# Patient Record
Sex: Female | Born: 1990
Health system: Southern US, Community
[De-identification: ages and names within clinical notes are randomized; demographics above are authoritative.]

## PROBLEM LIST (undated history)

## (undated) DIAGNOSIS — A549 Gonococcal infection, unspecified: Secondary | ICD-10-CM

## (undated) DIAGNOSIS — B9689 Other specified bacterial agents as the cause of diseases classified elsewhere: Secondary | ICD-10-CM

## (undated) HISTORY — DX: Other specified bacterial agents as the cause of diseases classified elsewhere: B96.89

## (undated) HISTORY — DX: Gonococcal infection, unspecified: A54.9

---

## 2012-09-10 ENCOUNTER — Emergency Department (HOSPITAL_BASED_OUTPATIENT_CLINIC_OR_DEPARTMENT_OTHER): Payer: No Typology Code available for payment source

## 2012-09-10 ENCOUNTER — Encounter (HOSPITAL_BASED_OUTPATIENT_CLINIC_OR_DEPARTMENT_OTHER): Payer: Self-pay | Admitting: *Deleted

## 2012-09-10 ENCOUNTER — Emergency Department (HOSPITAL_BASED_OUTPATIENT_CLINIC_OR_DEPARTMENT_OTHER)
Admission: EM | Admit: 2012-09-10 | Discharge: 2012-09-10 | Disposition: A | Discharge status: Home / Self Care | Payer: No Typology Code available for payment source | Attending: Emergency Medicine | Admitting: Emergency Medicine

## 2012-09-10 DIAGNOSIS — Y9389 Activity, other specified: Secondary | ICD-10-CM | POA: Insufficient documentation

## 2012-09-10 DIAGNOSIS — S6990XA Unspecified injury of unspecified wrist, hand and finger(s), initial encounter: Secondary | ICD-10-CM | POA: Insufficient documentation

## 2012-09-10 DIAGNOSIS — Y92410 Unspecified street and highway as the place of occurrence of the external cause: Secondary | ICD-10-CM | POA: Insufficient documentation

## 2012-09-10 MED ORDER — TRAMADOL HCL 50 MG PO TABS: 50.0000 mg | ORAL_TABLET | Freq: Four times a day (QID) | ORAL | Status: DC | PRN
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
## 2012-09-10 NOTE — ED Provider Notes (Signed)
Medical screening examination/treatment/procedure(s) were performed by non-physician practitioner and as supervising physician I was immediately available for consultation/collaboration.   Rolan Bucco, MD 09/10/12 [phone]
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
## 2012-09-14 ENCOUNTER — Emergency Department (HOSPITAL_BASED_OUTPATIENT_CLINIC_OR_DEPARTMENT_OTHER)
Admission: EM | Admit: 2012-09-14 | Discharge: 2012-09-14 | Disposition: A | Discharge status: Home / Self Care | Payer: No Typology Code available for payment source | Attending: Emergency Medicine | Admitting: Emergency Medicine

## 2012-09-14 ENCOUNTER — Encounter (HOSPITAL_BASED_OUTPATIENT_CLINIC_OR_DEPARTMENT_OTHER): Payer: Self-pay | Admitting: *Deleted

## 2012-09-14 DIAGNOSIS — S6990XA Unspecified injury of unspecified wrist, hand and finger(s), initial encounter: Secondary | ICD-10-CM

## 2012-09-14 DIAGNOSIS — Z0279 Encounter for issue of other medical certificate: Secondary | ICD-10-CM | POA: Insufficient documentation

## 2012-09-14 DIAGNOSIS — Z09 Encounter for follow-up examination after completed treatment for conditions other than malignant neoplasm: Secondary | ICD-10-CM | POA: Insufficient documentation

## 2012-09-14 NOTE — ED Notes (Signed)
Pt seen here sat for hand injury , requesting work note to go back to work, Previous note  Was till  Feb 3

## 2012-09-14 NOTE — ED Provider Notes (Signed)
History     CSN: [id number]  Arrival date & time 09/14/12  1615   First MD Initiated Contact with Patient 09/14/12 1628      Chief Complaint  Patient presents with  . Follow-up    (Consider location/radiation/quality/duration/timing/severity/associated sxs/prior treatment) HPI Pt reports she was seen in the ED for a hand injury about 4-5 days ago. She is feeling better, pain mostly resolved and she needs a note clearing her to return to work.   History reviewed. No pertinent past medical history.  History reviewed. No pertinent past surgical history.  History reviewed. No pertinent family history.  History  Substance Use Topics  . Smoking status: Never Smoker   . Smokeless tobacco: Not on file  . Alcohol Use: No    OB History    Grav Para Term Preterm Abortions TAB SAB Ect Mult Living                  Review of Systems   All other systems reviewed and are negative except as noted in HPI.   Allergies  Review of patient's allergies indicates not on file.  Home Medications   Current Outpatient Rx  Name  Route  Sig  Dispense  Refill  . TRAMADOL HCL 50 MG PO TABS   Oral   Take 1 tablet (50 mg total) by mouth every 6 (six) hours as needed for pain.   6 tablet   0     BP 111/68  Pulse 73  Temp 98.6 F (37 C) (Oral)  Resp 16  Ht 5\' 6"  (1.676 m)  Wt 147 lb (66.679 kg)  BMI 23.73 kg/m2  SpO2 100%  LMP 08/20/2012  Physical Exam  Nursing note and vitals reviewed. Constitutional: She is oriented to person, place, and time. She appears well-developed and well-nourished.  HENT:  Head: Normocephalic and atraumatic.  Eyes: EOM are normal.  Neck: Normal range of motion. Neck supple.  Cardiovascular: Intact distal pulses.   Pulmonary/Chest: Effort normal.  Musculoskeletal: Normal range of motion. She exhibits no edema and no tenderness.  Neurological: She is alert and oriented to person, place, and time. She has normal strength. No cranial nerve deficit or  sensory deficit.  Skin: Skin is warm and dry. No rash noted.  Psychiatric: She has a normal mood and affect.    ED Course  Procedures (including critical care time)  Labs Reviewed - No data to display No results found.   No diagnosis found.    MDM  Work note provided.         Charles B. Bernette Mayers, MD 09/14/12 [phone]

## 2013-07-28 ENCOUNTER — Emergency Department (HOSPITAL_BASED_OUTPATIENT_CLINIC_OR_DEPARTMENT_OTHER)
Admission: EM | Admit: 2013-07-28 | Discharge: 2013-07-28 | Discharge status: Against Medical Advice | Payer: Self-pay | Attending: Emergency Medicine | Admitting: Emergency Medicine

## 2013-07-28 ENCOUNTER — Encounter (HOSPITAL_BASED_OUTPATIENT_CLINIC_OR_DEPARTMENT_OTHER): Payer: Self-pay | Admitting: Emergency Medicine

## 2013-07-28 DIAGNOSIS — R109 Unspecified abdominal pain: Secondary | ICD-10-CM | POA: Insufficient documentation

## 2013-07-28 DIAGNOSIS — R070 Pain in throat: Secondary | ICD-10-CM | POA: Insufficient documentation

## 2013-07-28 NOTE — ED Notes (Signed)
Patient called x 3 for treatment room with no response

## 2013-07-28 NOTE — ED Notes (Addendum)
Pt c/o URI symptoms x 2 days also c/o abd pain prior to menstral cycle every moth

## 2013-09-25 ENCOUNTER — Emergency Department (HOSPITAL_BASED_OUTPATIENT_CLINIC_OR_DEPARTMENT_OTHER)
Admission: EM | Admit: 2013-09-25 | Discharge: 2013-09-25 | Disposition: A | Discharge status: Home / Self Care | Payer: 59 | Attending: Emergency Medicine | Admitting: Emergency Medicine

## 2013-09-25 ENCOUNTER — Encounter (HOSPITAL_BASED_OUTPATIENT_CLINIC_OR_DEPARTMENT_OTHER): Payer: Self-pay | Admitting: Emergency Medicine

## 2013-09-25 DIAGNOSIS — R110 Nausea: Secondary | ICD-10-CM | POA: Insufficient documentation

## 2013-09-25 DIAGNOSIS — R109 Unspecified abdominal pain: Secondary | ICD-10-CM

## 2013-09-25 DIAGNOSIS — Z87440 Personal history of urinary (tract) infections: Secondary | ICD-10-CM | POA: Insufficient documentation

## 2013-09-25 DIAGNOSIS — R1032 Left lower quadrant pain: Secondary | ICD-10-CM | POA: Insufficient documentation

## 2013-09-25 DIAGNOSIS — Z3202 Encounter for pregnancy test, result negative: Secondary | ICD-10-CM | POA: Insufficient documentation

## 2013-09-25 LAB — URINALYSIS, ROUTINE W REFLEX MICROSCOPIC
Bilirubin Urine: NEGATIVE
GLUCOSE, UA: NEGATIVE mg/dL
Hgb urine dipstick: NEGATIVE
Ketones, ur: NEGATIVE mg/dL
LEUKOCYTES UA: NEGATIVE
Nitrite: NEGATIVE
PH: 7 (ref 5.0–8.0)
PROTEIN: NEGATIVE mg/dL
SPECIFIC GRAVITY, URINE: 1.02 (ref 1.005–1.030)
Urobilinogen, UA: 1 mg/dL (ref 0.0–1.0)

## 2013-09-25 LAB — PREGNANCY, URINE: PREG TEST UR: NEGATIVE

## 2013-09-25 NOTE — ED Notes (Signed)
NP at bedside.

## 2013-09-25 NOTE — Discharge Instructions (Signed)
Abdominal Pain, Women °Abdominal (stomach, pelvic, or belly) pain can be caused by many things. It is important to tell your doctor: °· The location of the pain. °· Does it come and go or is it present all the time? °· Are there things that start the pain (eating certain foods, exercise)? °· Are there other symptoms associated with the pain (fever, nausea, vomiting, diarrhea)? °All of this is helpful to know when trying to find the cause of the pain. °CAUSES  °· Stomach: virus or bacteria infection, or ulcer. °· Intestine: appendicitis (inflamed appendix), regional ileitis (Crohn's disease), ulcerative colitis (inflamed colon), irritable bowel syndrome, diverticulitis (inflamed diverticulum of the colon), or cancer of the stomach or intestine. °· Gallbladder disease or stones in the gallbladder. °· Kidney disease, kidney stones, or infection. °· Pancreas infection or cancer. °· Fibromyalgia (pain disorder). °· Diseases of the female organs: °· Uterus: fibroid (non-cancerous) tumors or infection. °· Fallopian tubes: infection or tubal pregnancy. °· Ovary: cysts or tumors. °· Pelvic adhesions (scar tissue). °· Endometriosis (uterus lining tissue growing in the pelvis and on the pelvic organs). °· Pelvic congestion syndrome (female organs filling up with blood just before the menstrual period). °· Pain with the menstrual period. °· Pain with ovulation (producing an egg). °· Pain with an IUD (intrauterine device, birth control) in the uterus. °· Cancer of the female organs. °· Functional pain (pain not caused by a disease, may improve without treatment). °· Psychological pain. °· Depression. °DIAGNOSIS  °Your doctor will decide the seriousness of your pain by doing an examination. °· Blood tests. °· X-rays. °· Ultrasound. °· CT scan (computed tomography, special type of X-ray). °· MRI (magnetic resonance imaging). °· Cultures, for infection. °· Barium enema (dye inserted in the large intestine, to better view it with  X-rays). °· Colonoscopy (looking in intestine with a lighted tube). °· Laparoscopy (minor surgery, looking in abdomen with a lighted tube). °· Major abdominal exploratory surgery (looking in abdomen with a large incision). °TREATMENT  °The treatment will depend on the cause of the pain.  °· Many cases can be observed and treated at home. °· Over-the-counter medicines recommended by your caregiver. °· Prescription medicine. °· Antibiotics, for infection. °· Birth control pills, for painful periods or for ovulation pain. °· Hormone treatment, for endometriosis. °· Nerve blocking injections. °· Physical therapy. °· Antidepressants. °· Counseling with a psychologist or psychiatrist. °· Minor or major surgery. °HOME CARE INSTRUCTIONS  °· Do not take laxatives, unless directed by your caregiver. °· Take over-the-counter pain medicine only if ordered by your caregiver. Do not take aspirin because it can cause an upset stomach or bleeding. °· Try a clear liquid diet (broth or water) as ordered by your caregiver. Slowly move to a bland diet, as tolerated, if the pain is related to the stomach or intestine. °· Have a thermometer and take your temperature several times a day, and record it. °· Bed rest and sleep, if it helps the pain. °· Avoid sexual intercourse, if it causes pain. °· Avoid stressful situations. °· Keep your follow-up appointments and tests, as your caregiver orders. °· If the pain does not go away with medicine or surgery, you may try: °· Acupuncture. °· Relaxation exercises (yoga, meditation). °· Group therapy. °· Counseling. °SEEK MEDICAL CARE IF:  °· You notice certain foods cause stomach pain. °· Your home care treatment is not helping your pain. °· You need stronger pain medicine. °· You want your IUD removed. °· You feel faint or   lightheaded. °· You develop nausea and vomiting. °· You develop a rash. °· You are having side effects or an allergy to your medicine. °SEEK IMMEDIATE MEDICAL CARE IF:  °· Your  pain does not go away or gets worse. °· You have a fever. °· Your pain is felt only in portions of the abdomen. The right side could possibly be appendicitis. The left lower portion of the abdomen could be colitis or diverticulitis. °· You are passing blood in your stools (bright red or black tarry stools, with or without vomiting). °· You have blood in your urine. °· You develop chills, with or without a fever. °· You pass out. °MAKE SURE YOU:  °· Understand these instructions. °· Will watch your condition. °· Will get help right away if you are not doing well or get worse. °Document Released: 05/31/2007 Document Revised: 10/26/2011 Document Reviewed: 06/20/2009 °ExitCare® Patient Information ©2014 [employer]. ° °

## 2013-09-25 NOTE — ED Notes (Signed)
Pt c/o LLQ pain x 2 months intermittent.

## 2013-09-25 NOTE — ED Provider Notes (Signed)
CSN: [id number]     Arrival date & time 09/25/13  1653 History   First MD Initiated Contact with Patient 09/25/13 1658     Chief Complaint  Patient presents with  . Abdominal Pain     (Consider location/radiation/quality/duration/timing/severity/associated sxs/prior Treatment) HPI Comments: Pt c/o intermittent llq abdominal pain that last for about 1 hour at a time and has been going on for 2 months;denies dysuria,discharge,vomiting, diarrhea;has had some nausea and wants to make sure that she isn't pregnant:pt not having the pain currently:was recently treated for uti and tested for std  The history is provided by the patient. No language interpreter was used.    History reviewed. No pertinent past medical history. History reviewed. No pertinent past surgical history. No family history on file. History  Substance Use Topics  . Smoking status: Never Smoker   . Smokeless tobacco: Not on file  . Alcohol Use: No   OB History   Grav Para Term Preterm Abortions TAB SAB Ect Mult Living                 Review of Systems  Constitutional: Negative.   Respiratory: Negative.   Cardiovascular: Negative.       Allergies  Review of patient's allergies indicates no known allergies.  Home Medications  No current outpatient prescriptions on file. BP 117/82  Pulse 68  Temp(Src) 98.1 F (36.7 C) (Oral)  Resp 16  Ht 5\' 6"  (1.676 m)  Wt 140 lb (63.504 kg)  BMI 22.61 kg/m2  SpO2 100%  LMP 09/11/2013 Physical Exam  Nursing note and vitals reviewed. Constitutional: She is oriented to person, place, and time. She appears well-developed and well-nourished.  HENT:  Head: Normocephalic and atraumatic.  Cardiovascular: Normal rate and regular rhythm.   Pulmonary/Chest: Effort normal and breath sounds normal.  Abdominal: Soft. Bowel sounds are normal. There is no tenderness. There is no rebound and no guarding.  Musculoskeletal: Normal range of motion.  Neurological: She is alert and  oriented to person, place, and time.  Skin: Skin is warm and dry.  Psychiatric: She has a normal mood and affect.    ED Course  Procedures (including critical care time) Labs Review Labs Reviewed  URINALYSIS, ROUTINE W REFLEX MICROSCOPIC  PREGNANCY, URINE   Imaging Review No results found.  EKG Interpretation   None       MDM   Final diagnoses:  Abdominal pain    Pt not pregnant and abdomen benign:don't think imaging is needed at this time    Teressa LowerVrinda Anthone Prieur, NP 09/25/13 1813

## 2013-09-30 NOTE — ED Provider Notes (Signed)
Medical screening examination/treatment/procedure(s) were performed by non-physician practitioner and as supervising physician I was immediately available for consultation/collaboration.  EKG Interpretation   None         Rolland PorterMark Isacc Turney, MD 09/30/13 [phone]

## 2013-12-04 ENCOUNTER — Telehealth: Payer: Self-pay | Admitting: *Deleted

## 2013-12-04 ENCOUNTER — Encounter: Payer: Self-pay | Admitting: Nurse Practitioner

## 2013-12-04 NOTE — Telephone Encounter (Signed)
Called Tabitha Robinson and left a message that she missed her scheduled appointment for today at 1:15. Please call back [phone] if you wish to reschedule.

## 2014-05-24 ENCOUNTER — Encounter (HOSPITAL_BASED_OUTPATIENT_CLINIC_OR_DEPARTMENT_OTHER): Payer: Self-pay | Admitting: Emergency Medicine

## 2014-05-24 ENCOUNTER — Emergency Department (HOSPITAL_BASED_OUTPATIENT_CLINIC_OR_DEPARTMENT_OTHER)
Admission: EM | Admit: 2014-05-24 | Discharge: 2014-05-24 | Disposition: A | Discharge status: Home / Self Care | Payer: 59 | Attending: Emergency Medicine | Admitting: Emergency Medicine

## 2014-05-24 DIAGNOSIS — H9209 Otalgia, unspecified ear: Secondary | ICD-10-CM | POA: Insufficient documentation

## 2014-05-24 DIAGNOSIS — R05 Cough: Secondary | ICD-10-CM | POA: Insufficient documentation

## 2014-05-24 DIAGNOSIS — J029 Acute pharyngitis, unspecified: Secondary | ICD-10-CM

## 2014-05-24 LAB — RAPID STREP SCREEN (MED CTR MEBANE ONLY): STREPTOCOCCUS, GROUP A SCREEN (DIRECT): NEGATIVE

## 2014-05-24 MED ORDER — IBUPROFEN 800 MG PO TABS: 800.0000 mg | ORAL_TABLET | Freq: Three times a day (TID) | ORAL | Status: DC

## 2014-05-24 MED ORDER — BENZONATATE 100 MG PO CAPS: 100.0000 mg | ORAL_CAPSULE | Freq: Three times a day (TID) | ORAL | Status: DC

## 2014-05-24 MED ORDER — LIDOCAINE VISCOUS 2 % MT SOLN
15.0000 mL | Freq: Once | OROMUCOSAL | Status: AC
  Administered 2014-05-24: 15 mL via OROMUCOSAL
  Filled 2014-05-24: qty 15

## 2014-05-24 MED ORDER — IBUPROFEN 800 MG PO TABS
800.0000 mg | ORAL_TABLET | Freq: Once | ORAL | Status: AC
  Administered 2014-05-24: 800 mg via ORAL
  Filled 2014-05-24: qty 1

## 2014-05-24 MED ORDER — LIDOCAINE VISCOUS 2 % MT SOLN: 20.0000 mL | OROMUCOSAL | Status: DC | PRN

## 2014-05-24 NOTE — ED Notes (Signed)
Pt c/o sore throat and painful swallowing x 4 days- right ear pain also

## 2014-05-24 NOTE — ED Provider Notes (Signed)
CSN: [id number]     Arrival date & time 05/24/14  1216 History   First MD Initiated Contact with Patient 05/24/14 1220     Chief Complaint  Patient presents with  . Sore Throat     (Consider location/radiation/quality/duration/timing/severity/associated sxs/prior Treatment) HPI Comments: Patient is a 23 year old female presented to the emergency department for 5 day history of sore throat with associated painful swallowing, productive cough, right ear pain. Alleviating factors: warm water with honey. Aggravating factors: eating, drinking. Medications tried prior to arrival: Aspirin. Denies any sick contacts. Denies any fevers, chills, headache, nasal congestion or rhinorrhea, nausea, vomiting, abdominal pain. No known sick contacts.    Patient is a 23 y.o. female presenting with pharyngitis.  Sore Throat Associated symptoms include coughing and a sore throat. Pertinent negatives include no chills or fever.    History reviewed. No pertinent past medical history. History reviewed. No pertinent past surgical history. No family history on file. History  Substance Use Topics  . Smoking status: Never Smoker   . Smokeless tobacco: Not on file  . Alcohol Use: No   OB History   Grav Para Term Preterm Abortions TAB SAB Ect Mult Living                 Review of Systems  Constitutional: Negative for fever and chills.  HENT: Positive for ear pain and sore throat.   Respiratory: Positive for cough.   All other systems reviewed and are negative.     Allergies  Review of patient's allergies indicates no known allergies.  Home Medications   Prior to Admission medications   Medication Sig Start Date End Date Taking? Authorizing Provider  benzonatate (TESSALON) 100 MG capsule Take 1 capsule (100 mg total) by mouth every 8 (eight) hours. 05/24/14   Jenaro Souder L Bellah Alia, PA-C  ibuprofen (ADVIL,MOTRIN) 800 MG tablet Take 1 tablet (800 mg total) by mouth 3 (three) times daily. 05/24/14    Draylen Lobue L Marysol Wellnitz, PA-C  lidocaine (XYLOCAINE) 2 % solution Use as directed 20 mLs in the mouth or throat as needed for mouth pain. 05/24/14   Shahan Starks L Tersa Fotopoulos, PA-C   BP 112/65  Pulse 67  Temp(Src) 98.2 F (36.8 C) (Oral)  Resp 18  Ht 5\' 6"  (1.676 m)  Wt 156 lb (70.761 kg)  BMI 25.19 kg/m2  SpO2 100%  LMP 05/20/2014 Physical Exam  Nursing note and vitals reviewed. Constitutional: She is oriented to person, place, and time. She appears well-developed and well-nourished. No distress.  HENT:  Head: Normocephalic and atraumatic.  Right Ear: Hearing, tympanic membrane, external ear and ear canal normal.  Left Ear: Hearing, tympanic membrane, external ear and ear canal normal.  Nose: Nose normal.  Mouth/Throat: Uvula is midline and mucous membranes are normal. Posterior oropharyngeal erythema present. No oropharyngeal exudate, posterior oropharyngeal edema or tonsillar abscesses.  Eyes: Conjunctivae are normal.  Neck: Normal range of motion. Neck supple.  Cardiovascular: Normal rate, regular rhythm and normal heart sounds.   Pulmonary/Chest: Effort normal and breath sounds normal.  Abdominal: Soft.  Musculoskeletal: Normal range of motion.  Lymphadenopathy:    She has cervical adenopathy.  Neurological: She is alert and oriented to person, place, and time.  Skin: Skin is warm and dry. She is not diaphoretic.  Psychiatric: She has a normal mood and affect.    ED Course  Procedures (including critical care time) Medications  ibuprofen (ADVIL,MOTRIN) tablet 800 mg (800 mg Oral Given 05/24/14 1300)  lidocaine (XYLOCAINE) 2 % viscous mouth  solution 15 mL (15 mLs Mouth/Throat Given 05/24/14 1259)    Labs Review Labs Reviewed  RAPID STREP SCREEN  CULTURE, GROUP A STREP    Imaging Review No results found.   EKG Interpretation None      MDM   Final diagnoses:  Viral pharyngitis    Filed Vitals:   05/24/14 1327  BP: 112/65  Pulse: 67  Temp: 98.2 F (36.8 C)   Resp: 18    Pt afebrile without tonsillar exudate, negative strep. Presents with mild cervical lymphadenopathy, & dysphagia; diagnosis of viral pharyngitis. No abx indicated. DC w symptomatic tx for pain  Pt does not appear dehydrated, but did discuss importance of water rehydration. Presentation non concerning for PTA or infxn spread to soft tissue. No trismus or uvula deviation. Specific return precautions discussed. Pt able to drink water in ED without difficulty with intact air way. Recommended PCP follow up. Patient is stable at time of discharge      Jeannetta EllisJennifer L Yeny Schmoll, PA-C 05/24/14 1330

## 2014-05-24 NOTE — Discharge Instructions (Signed)
Please follow up with your primary care physician in 1-2 days. If you do not have one please call the [facility] number listed above. Please take medications as prescribed. Please read all discharge instructions and return precautions.  ° °Pharyngitis °Pharyngitis is redness, pain, and swelling (inflammation) of your pharynx.  °CAUSES  °Pharyngitis is usually caused by infection. Most of the time, these infections are from viruses (viral) and are part of a cold. However, sometimes pharyngitis is caused by bacteria (bacterial). Pharyngitis can also be caused by allergies. Viral pharyngitis may be spread from person to person by coughing, sneezing, and personal items or utensils (cups, forks, spoons, toothbrushes). Bacterial pharyngitis may be spread from person to person by more intimate contact, such as kissing.  °SIGNS AND SYMPTOMS  °Symptoms of pharyngitis include:   °· Sore throat.   °· Tiredness (fatigue).   °· Low-grade fever.   °· Headache. °· Joint pain and muscle aches. °· Skin rashes. °· Swollen lymph nodes. °· Plaque-like film on throat or tonsils (often seen with bacterial pharyngitis). °DIAGNOSIS  °Your health care provider will ask you questions about your illness and your symptoms. Your medical history, along with a physical exam, is often all that is needed to diagnose pharyngitis. Sometimes, a rapid strep test is done. Other lab tests may also be done, depending on the suspected cause.  °TREATMENT  °Viral pharyngitis will usually get better in 3-4 days without the use of medicine. Bacterial pharyngitis is treated with medicines that kill germs (antibiotics).  °HOME CARE INSTRUCTIONS  °· Drink enough water and fluids to keep your urine clear or pale yellow.   °· Only take over-the-counter or prescription medicines as directed by your health care provider:   °¨ If you are prescribed antibiotics, make sure you finish them even if you start to feel better.   °¨ Do not take aspirin.    °· Get lots of rest.   °· Gargle with 8 oz of salt water (½ tsp of salt per 1 qt of water) as often as every 1-2 hours to soothe your throat.   °· Throat lozenges (if you are not at risk for choking) or sprays may be used to soothe your throat. °SEEK MEDICAL CARE IF:  °· You have large, tender lumps in your neck. °· You have a rash. °· You cough up green, yellow-brown, or bloody spit. °SEEK IMMEDIATE MEDICAL CARE IF:  °· Your neck becomes stiff. °· You drool or are unable to swallow liquids. °· You vomit or are unable to keep medicines or liquids down. °· You have severe pain that does not go away with the use of recommended medicines. °· You have trouble breathing (not caused by a stuffy nose). °MAKE SURE YOU:  °· Understand these instructions. °· Will watch your condition. °· Will get help right away if you are not doing well or get worse. °Document Released: 08/03/2005 Document Revised: 05/24/2013 Document Reviewed: 04/10/2013 °[employer]® Patient Information ©2015 [employer]. This information is not intended to replace advice given to you by your health care provider. Make sure you discuss any questions you have with your health care provider. ° ° °

## 2014-05-26 LAB — CULTURE, GROUP A STREP

## 2014-05-28 NOTE — ED Provider Notes (Signed)
Medical screening examination/treatment/procedure(s) were conducted as a shared visit with non-physician practitioner(s) or resident  and myself.  I personally evaluated the patient during the encounter and agree with the findings.   I have personally reviewed any xrays and/ or EKG's with the provider and I agree with interpretation.   Well-appearing female with sore throat and cough for the past 4-5 days. Patient well-appearing, mild dry mucous membranes, no significant posterior swelling/exudate. No trismus, neck supple no meningismus. Supportive care discussed.  Labs Reviewed  RAPID STREP SCREEN  CULTURE, GROUP A STREP   Acute pharyngitis   Enid SkeensJoshua M Verita Kuroda, MD 05/28/14 1320

## 2014-08-01 ENCOUNTER — Emergency Department (HOSPITAL_BASED_OUTPATIENT_CLINIC_OR_DEPARTMENT_OTHER)
Admission: EM | Admit: 2014-08-01 | Discharge: 2014-08-01 | Disposition: A | Discharge status: Home / Self Care | Payer: Self-pay | Attending: Emergency Medicine | Admitting: Emergency Medicine

## 2014-08-01 ENCOUNTER — Emergency Department (HOSPITAL_BASED_OUTPATIENT_CLINIC_OR_DEPARTMENT_OTHER): Payer: 59

## 2014-08-01 ENCOUNTER — Encounter (HOSPITAL_BASED_OUTPATIENT_CLINIC_OR_DEPARTMENT_OTHER): Payer: Self-pay

## 2014-08-01 DIAGNOSIS — R1030 Lower abdominal pain, unspecified: Secondary | ICD-10-CM

## 2014-08-01 DIAGNOSIS — N3090 Cystitis, unspecified without hematuria: Secondary | ICD-10-CM | POA: Insufficient documentation

## 2014-08-01 DIAGNOSIS — Z3202 Encounter for pregnancy test, result negative: Secondary | ICD-10-CM | POA: Insufficient documentation

## 2014-08-01 DIAGNOSIS — R630 Anorexia: Secondary | ICD-10-CM | POA: Insufficient documentation

## 2014-08-01 DIAGNOSIS — N730 Acute parametritis and pelvic cellulitis: Secondary | ICD-10-CM | POA: Insufficient documentation

## 2014-08-01 LAB — URINALYSIS, ROUTINE W REFLEX MICROSCOPIC
GLUCOSE, UA: NEGATIVE mg/dL
Ketones, ur: >80 mg/dL — AB
Nitrite: NEGATIVE
PROTEIN: 100 mg/dL — AB
Specific Gravity, Urine: 1.031 — ABNORMAL HIGH (ref 1.005–1.030)
Urobilinogen, UA: 1 mg/dL (ref 0.0–1.0)
pH: 5.5 (ref 5.0–8.0)

## 2014-08-01 LAB — CBC WITH DIFFERENTIAL/PLATELET
Basophils Absolute: 0 K/uL (ref 0.0–0.1)
Basophils Relative: 0 % (ref 0–1)
Eosinophils Absolute: 0 K/uL (ref 0.0–0.7)
Eosinophils Relative: 0 % (ref 0–5)
HCT: 36.1 % (ref 36.0–46.0)
Hemoglobin: 12.1 g/dL (ref 12.0–15.0)
Lymphocytes Relative: 8 % — ABNORMAL LOW (ref 12–46)
Lymphs Abs: 1.6 K/uL (ref 0.7–4.0)
MCH: 30.1 pg (ref 26.0–34.0)
MCHC: 33.5 g/dL (ref 30.0–36.0)
MCV: 89.8 fL (ref 78.0–100.0)
Monocytes Absolute: 1.2 K/uL — ABNORMAL HIGH (ref 0.1–1.0)
Monocytes Relative: 6 % (ref 3–12)
Neutro Abs: 17.8 K/uL — ABNORMAL HIGH (ref 1.7–7.7)
Neutrophils Relative %: 86 % — ABNORMAL HIGH (ref 43–77)
Platelets: 204 K/uL (ref 150–400)
RBC: 4.02 MIL/uL (ref 3.87–5.11)
RDW: 12.6 % (ref 11.5–15.5)
WBC: 20.7 K/uL — ABNORMAL HIGH (ref 4.0–10.5)

## 2014-08-01 LAB — COMPREHENSIVE METABOLIC PANEL WITH GFR
ALT: 13 U/L (ref 0–35)
AST: 14 U/L (ref 0–37)
Albumin: 4.1 g/dL (ref 3.5–5.2)
Alkaline Phosphatase: 64 U/L (ref 39–117)
Anion gap: 15 (ref 5–15)
BUN: 10 mg/dL (ref 6–23)
CO2: 23 meq/L (ref 19–32)
Calcium: 9.6 mg/dL (ref 8.4–10.5)
Chloride: 98 meq/L (ref 96–112)
Creatinine, Ser: 0.7 mg/dL (ref 0.50–1.10)
GFR calc Af Amer: >90 mL/min
GFR calc non Af Amer: >90 mL/min
Glucose, Bld: 91 mg/dL (ref 70–99)
Potassium: 3.7 meq/L (ref 3.7–5.3)
Sodium: 136 meq/L — ABNORMAL LOW (ref 137–147)
Total Bilirubin: 0.8 mg/dL (ref 0.3–1.2)
Total Protein: 7.9 g/dL (ref 6.0–8.3)

## 2014-08-01 LAB — RPR

## 2014-08-01 LAB — URINE MICROSCOPIC-ADD ON

## 2014-08-01 LAB — WET PREP, GENITAL
Trich, Wet Prep: NONE SEEN
Yeast Wet Prep HPF POC: NONE SEEN

## 2014-08-01 LAB — HIV ANTIBODY (ROUTINE TESTING W REFLEX): HIV 1&2 Ab, 4th Generation: NONREACTIVE

## 2014-08-01 LAB — PREGNANCY, URINE: Preg Test, Ur: NEGATIVE

## 2014-08-01 MED ORDER — ACETAMINOPHEN 500 MG PO TABS
1000.0000 mg | ORAL_TABLET | Freq: Once | ORAL | Status: AC
  Administered 2014-08-01: 1000 mg via ORAL
  Filled 2014-08-01: qty 2

## 2014-08-01 MED ORDER — IOHEXOL 300 MG/ML  SOLN
25.0000 mL | Freq: Once | INTRAMUSCULAR | Status: AC | PRN
  Administered 2014-08-01: 25 mL via ORAL

## 2014-08-01 MED ORDER — ONDANSETRON 4 MG PO TBDP: ORAL_TABLET | ORAL | Status: DC

## 2014-08-01 MED ORDER — DOXYCYCLINE HYCLATE 100 MG PO TABS
100.0000 mg | ORAL_TABLET | Freq: Once | ORAL | Status: AC
  Administered 2014-08-01: 100 mg via ORAL
  Filled 2014-08-01: qty 1

## 2014-08-01 MED ORDER — DOXYCYCLINE HYCLATE 100 MG PO CAPS: 100.0000 mg | ORAL_CAPSULE | Freq: Two times a day (BID) | ORAL | Status: DC

## 2014-08-01 MED ORDER — CEFTRIAXONE SODIUM 250 MG IJ SOLR
250.0000 mg | Freq: Once | INTRAMUSCULAR | Status: AC
  Administered 2014-08-01: 250 mg via INTRAMUSCULAR
  Filled 2014-08-01: qty 250

## 2014-08-01 MED ORDER — IOHEXOL 300 MG/ML  SOLN
100.0000 mL | Freq: Once | INTRAMUSCULAR | Status: AC | PRN
  Administered 2014-08-01: 100 mL via INTRAVENOUS

## 2014-08-01 NOTE — ED Provider Notes (Signed)
CSN: [id number]     Arrival date & time 08/01/14  1121 History   First MD Initiated Contact with Patient 08/01/14 1130     Chief Complaint  Patient presents with  . Abdominal Pain     (Consider location/radiation/quality/duration/timing/severity/associated sxs/prior Treatment) Patient is a 23 y.o. female presenting with abdominal pain.  Abdominal Pain Pain location:  Suprapubic Pain quality: aching   Pain radiates to:  Does not radiate Pain severity:  Moderate Onset quality:  Gradual Duration:  1 week Timing:  Constant Progression:  Worsening Chronicity:  New Context: not recent illness   Context comment:  HO GC Relieved by:  Nothing Worsened by:  Movement and palpation Ineffective treatments:  None tried Associated symptoms: anorexia and chills   Associated symptoms: no cough, no diarrhea, no fever, no nausea and no vomiting     History reviewed. No pertinent past medical history. History reviewed. No pertinent past surgical history. No family history on file. History  Substance Use Topics  . Smoking status: Never Smoker   . Smokeless tobacco: Not on file  . Alcohol Use: No   OB History    No data available     Review of Systems  Constitutional: Positive for chills. Negative for fever.  Respiratory: Negative for cough.   Gastrointestinal: Positive for abdominal pain and anorexia. Negative for nausea, vomiting and diarrhea.  All other systems reviewed and are negative.     Allergies  Review of patient's allergies indicates no known allergies.  Home Medications   Prior to Admission medications   Medication Sig Start Date End Date Taking? Authorizing Provider  doxycycline (VIBRAMYCIN) 100 MG capsule Take 1 capsule (100 mg total) by mouth 2 (two) times daily. 08/01/14   Mirian MoMatthew Hesper Venturella, MD  ondansetron (ZOFRAN ODT) 4 MG disintegrating tablet 4mg  ODT q4 hours prn nausea/vomit 08/01/14   Mirian MoMatthew Renika Shiflet, MD   BP 103/59 mmHg  Pulse 89  Temp(Src) 100.2 F (37.9  C) (Oral)  Resp 18  SpO2 100%  LMP 07/29/2014 Physical Exam  Constitutional: She is oriented to person, place, and time. She appears well-developed and well-nourished.  HENT:  Head: Normocephalic and atraumatic.  Right Ear: External ear normal.  Left Ear: External ear normal.  Eyes: Conjunctivae and EOM are normal. Pupils are equal, round, and reactive to light.  Neck: Normal range of motion. Neck supple.  Cardiovascular: Normal rate, regular rhythm, normal heart sounds and intact distal pulses.   Pulmonary/Chest: Effort normal and breath sounds normal.  Abdominal: Soft. Bowel sounds are normal. There is no tenderness.  Genitourinary: Uterus is tender. Cervix exhibits motion tenderness and discharge. Cervix exhibits no friability. Right adnexum displays tenderness. Right adnexum displays no mass and no fullness. Left adnexum displays tenderness. Left adnexum displays no mass and no fullness. Vaginal discharge found.  Musculoskeletal: Normal range of motion.  Neurological: She is alert and oriented to person, place, and time.  Skin: Skin is warm and dry.  Vitals reviewed.   ED Course  Procedures (including critical care time) Labs Review Labs Reviewed  WET PREP, GENITAL - Abnormal; Notable for the following:    Clue Cells Wet Prep HPF POC MANY (*)    WBC, Wet Prep HPF POC TOO NUMEROUS TO COUNT (*)    All other components within normal limits  URINALYSIS, ROUTINE W REFLEX MICROSCOPIC - Abnormal; Notable for the following:    Color, Urine AMBER (*)    APPearance TURBID (*)    Specific Gravity, Urine 1.031 (*)  Hgb urine dipstick MODERATE (*)    Bilirubin Urine SMALL (*)    Ketones, ur >80 (*)    Protein, ur 100 (*)    Leukocytes, UA MODERATE (*)    All other components within normal limits  URINE MICROSCOPIC-ADD ON - Abnormal; Notable for the following:    Squamous Epithelial / LPF FEW (*)    Bacteria, UA MANY (*)    All other components within normal limits  CBC WITH  DIFFERENTIAL - Abnormal; Notable for the following:    WBC 20.7 (*)    Neutrophils Relative % 86 (*)    Neutro Abs 17.8 (*)    Lymphocytes Relative 8 (*)    Monocytes Absolute 1.2 (*)    All other components within normal limits  COMPREHENSIVE METABOLIC PANEL - Abnormal; Notable for the following:    Sodium 136 (*)    All other components within normal limits  GC/CHLAMYDIA PROBE AMP  PREGNANCY, URINE  RPR  HIV ANTIBODY (ROUTINE TESTING)    Imaging Review Ct Abdomen Pelvis W Contrast  08/01/2014   CLINICAL DATA:  Lower abdominal pain  EXAM: CT ABDOMEN AND PELVIS WITH CONTRAST  TECHNIQUE: Multidetector CT imaging of the abdomen and pelvis was performed using the standard protocol following bolus administration of intravenous contrast.  CONTRAST:  25mL OMNIPAQUE IOHEXOL 300 MG/ML SOLN, 100mL OMNIPAQUE IOHEXOL 300 MG/ML SOLN  COMPARISON:  None.  FINDINGS: Lung bases are clear.  Liver and spleen are normal. Gallbladder and bile ducts are normal. Pancreas is normal.  15 mm cyst on the left lateral renal cortex. 5 mm adjacent cyst. No renal obstruction or mass. No urinary tract calculi. Urinary bladder is normal.  Negative for bowel obstruction or bowel thickening. Normal appendix. Negative for diverticulitis.  Anteverted uterus. Normal ovaries and uterus. No free fluid. Negative for mass or adenopathy. No acute spinal abnormality.  IMPRESSION: Negative   Electronically Signed   By: Marlan Palauharles  Clark M.D.   On: 08/01/2014 13:41     EKG Interpretation None      MDM   Final diagnoses:  Cystitis  PID (acute pelvic inflammatory disease)  Lower abdominal pain    23 y.o. female with pertinent PMH of prior GC presents with abd pain x 1 week, primarily suprapubic with onset of chills today.  On arrival today vitals signs and physical exam as above. Patient initially mildly tachycardic, this was not present on my exam. My examination was concerning for pelvic inflammatory disease. CT scan obtained  due to brown. Nature of discharge, high white count, abdominal tenderness and concern for tubo-ovarian abscess or fistula or other emergent pathology. This returned as above unremarkable. Likely etiology of patient's symptoms is pelvic inflammatory disease. Stressed the importance of completion of full course of doxycycline, even if the patient felt better, she assured me that she would finish all her antibiotics.  Also gave strict return precautions for nausea and worsening systemic symptoms which the patient voiced understanding and agreed to follow-up.    I have reviewed all laboratory and imaging studies if ordered as above  1. Cystitis   2. PID (acute pelvic inflammatory disease)   3. Lower abdominal pain         Mirian MoMatthew Minal Stuller, MD 08/01/14 1537

## 2014-08-01 NOTE — Discharge Instructions (Signed)
Pelvic Inflammatory Disease °Pelvic inflammatory disease (PID) refers to an infection in some or all of the female organs. The infection can be in the uterus, ovaries, fallopian tubes, or the surrounding tissues in the pelvis. PID can cause abdominal or pelvic pain that comes on suddenly (acute pelvic pain). PID is a serious infection because it can lead to lasting (chronic) pelvic pain or the inability to have children (infertile).  °CAUSES  °The infection is often caused by the normal bacteria found in the vaginal tissues. PID may also be caused by an infection that is spread during sexual contact. PID can also occur following:  °· The birth of a baby.   °· A miscarriage.   °· An abortion.   °· Major pelvic surgery.   °· The use of an intrauterine device (IUD).   °· A sexual assault.   °RISK FACTORS °Certain factors can put a person at higher risk for PID, such as: °· Being younger than 25 years. °· Being sexually active at a young age. °· Using nonbarrier contraception. °· Having multiple sexual partners. °· Having sex with someone who has symptoms of a genital infection. °· Using oral contraception. °Other times, certain behaviors can increase the possibility of getting PID, such as: °· Having sex during your period. °· Using a vaginal douche. °· Having an intrauterine device (IUD) in place. °SYMPTOMS  °· Abdominal or pelvic pain.   °· Fever.   °· Chills.   °· Abnormal vaginal discharge. °· Abnormal uterine bleeding.   °· Unusual pain shortly after finishing your period. °DIAGNOSIS  °Your caregiver will choose some of the following methods to make a diagnosis, such as:  °· Performing a physical exam and history. A pelvic exam typically reveals a very tender uterus and surrounding pelvis.   °· Ordering laboratory tests including a pregnancy test, blood tests, and urine test.  °· Ordering cultures of the vagina and cervix to check for a sexually transmitted infection (STI). °· Performing an ultrasound.    °· Performing a laparoscopic procedure to look inside the pelvis.   °TREATMENT  °· Antibiotic medicines may be prescribed and taken by mouth.   °· Sexual partners may be treated when the infection is caused by a sexually transmitted disease (STD).   °· Hospitalization may be needed to give antibiotics intravenously. °· Surgery may be needed, but this is rare. °It may take weeks until you are completely well. If you are diagnosed with PID, you should also be checked for human immunodeficiency virus (HIV).   °HOME CARE INSTRUCTIONS  °· If given, take your antibiotics as directed. Finish the medicine even if you start to feel better.   °· Only take over-the-counter or prescription medicines for pain, discomfort, or fever as directed by your caregiver.   °· Do not have sexual intercourse until treatment is completed or as directed by your caregiver. If PID is confirmed, your recent sexual partner(s) will need treatment.   °· Keep your follow-up appointments. °SEEK MEDICAL CARE IF:  °· You have increased or abnormal vaginal discharge.   °· You need prescription medicine for your pain.   °· You vomit.   °· You cannot take your medicines.   °· Your partner has an STD.   °SEEK IMMEDIATE MEDICAL CARE IF:  °· You have a fever.   °· You have increased abdominal or pelvic pain.   °· You have chills.   °· You have pain when you urinate.   °· You are not better after 72 hours following treatment.   °MAKE SURE YOU:  °· Understand these instructions. °· Will watch your condition. °· Will get help right away if you are not doing well or get worse. °  Document Released: 08/03/2005 Document Revised: 11/28/2012 Document Reviewed: 07/30/2011 °ExitCare® Patient Information ©2015 [employer]. This information is not intended to replace advice given to you by your health care provider. Make sure you discuss any questions you have with your health care provider. ° °

## 2014-08-01 NOTE — ED Notes (Signed)
MD at bedside. 

## 2014-08-01 NOTE — ED Notes (Signed)
C/o abd pain x 1 week-fever started last night-no meds

## 2014-08-02 LAB — GC/CHLAMYDIA PROBE AMP
CT PROBE, AMP APTIMA: NEGATIVE
GC Probe RNA: POSITIVE — AB

## 2014-08-04 ENCOUNTER — Telehealth: Payer: Self-pay | Admitting: Emergency Medicine

## 2014-08-04 NOTE — Telephone Encounter (Signed)
Positive Gonorrhea culture Chart sent to EDP for review 

## 2014-08-06 ENCOUNTER — Telehealth: Payer: Self-pay | Admitting: *Deleted

## 2014-10-22 ENCOUNTER — Encounter (HOSPITAL_BASED_OUTPATIENT_CLINIC_OR_DEPARTMENT_OTHER): Payer: Self-pay | Admitting: *Deleted

## 2014-10-22 ENCOUNTER — Emergency Department (HOSPITAL_BASED_OUTPATIENT_CLINIC_OR_DEPARTMENT_OTHER)
Admission: EM | Admit: 2014-10-22 | Discharge: 2014-10-22 | Disposition: A | Discharge status: Home / Self Care | Payer: Medicaid Other | Attending: Emergency Medicine | Admitting: Emergency Medicine

## 2014-10-22 DIAGNOSIS — Z880 Allergy status to penicillin: Secondary | ICD-10-CM | POA: Diagnosis not present

## 2014-10-22 DIAGNOSIS — Z79899 Other long term (current) drug therapy: Secondary | ICD-10-CM | POA: Diagnosis not present

## 2014-10-22 DIAGNOSIS — Z3A10 10 weeks gestation of pregnancy: Secondary | ICD-10-CM | POA: Insufficient documentation

## 2014-10-22 DIAGNOSIS — O210 Mild hyperemesis gravidarum: Secondary | ICD-10-CM | POA: Insufficient documentation

## 2014-10-22 DIAGNOSIS — O219 Vomiting of pregnancy, unspecified: Secondary | ICD-10-CM

## 2014-10-22 LAB — BASIC METABOLIC PANEL
Anion gap: 1 — ABNORMAL LOW (ref 5–15)
BUN: 9 mg/dL (ref 6–23)
CHLORIDE: 104 mmol/L (ref 96–112)
CO2: 26 mmol/L (ref 19–32)
Calcium: 9.4 mg/dL (ref 8.4–10.5)
Creatinine, Ser: 0.46 mg/dL — ABNORMAL LOW (ref 0.50–1.10)
GFR calc Af Amer: >90 mL/min (ref >90)
GFR calc non Af Amer: >90 mL/min (ref >90)
GLUCOSE: 108 mg/dL — AB (ref 70–99)
POTASSIUM: 3.6 mmol/L (ref 3.5–5.1)
SODIUM: 131 mmol/L — AB (ref 135–145)

## 2014-10-22 LAB — URINALYSIS, ROUTINE W REFLEX MICROSCOPIC
Bilirubin Urine: NEGATIVE
GLUCOSE, UA: NEGATIVE mg/dL
Hgb urine dipstick: NEGATIVE
KETONES UR: 15 mg/dL — AB
LEUKOCYTES UA: NEGATIVE
NITRITE: NEGATIVE
PROTEIN: NEGATIVE mg/dL
SPECIFIC GRAVITY, URINE: 1.023 (ref 1.005–1.030)
Urobilinogen, UA: 1 mg/dL (ref 0.0–1.0)
pH: 6.5 (ref 5.0–8.0)

## 2014-10-22 MED ORDER — ONDANSETRON HCL 4 MG/2ML IJ SOLN
4.0000 mg | Freq: Once | INTRAMUSCULAR | Status: AC
  Administered 2014-10-22: 4 mg via INTRAVENOUS
  Filled 2014-10-22: qty 2

## 2014-10-22 MED ORDER — SODIUM CHLORIDE 0.9 % IV BOLUS (SEPSIS)
1000.0000 mL | Freq: Once | INTRAVENOUS | Status: AC
  Administered 2014-10-22: 1000 mL via INTRAVENOUS

## 2014-10-22 NOTE — ED Provider Notes (Signed)
CSN: [id number]     Arrival date & time 10/22/14  1445 History   First MD Initiated Contact with Patient 10/22/14 1517     Chief Complaint  Patient presents with  . Emesis     (Consider location/radiation/quality/duration/timing/severity/associated sxs/prior Treatment) HPI Comments: Pt states that for last 2 weeks she has been unable to keep much food or fluid down. She states that she is [redacted] weeks pregnant. Denies diarrhea abdominal pain or dysuria. She states that she has seen by ob and they called her in a script for nausea medicine but she decided to come here because she didn't feel good. No fever, vaginal discharge or bleeding.  The history is provided by the patient. No language interpreter was used.    History reviewed. No pertinent past medical history. History reviewed. No pertinent past surgical history. No family history on file. History  Substance Use Topics  . Smoking status: Never Smoker   . Smokeless tobacco: Not on file  . Alcohol Use: No   OB History    Gravida Para Term Preterm AB TAB SAB Ectopic Multiple Living   1              Review of Systems  All other systems reviewed and are negative.     Allergies  Amoxicillin  Home Medications   Prior to Admission medications   Medication Sig Start Date End Date Taking? Authorizing Provider  Prenatal Vit-Fe Fumarate-FA (PRENATAL MULTIVITAMIN) TABS tablet Take 1 tablet by mouth daily at 12 noon.   Yes Historical Provider, MD  doxycycline (VIBRAMYCIN) 100 MG capsule Take 1 capsule (100 mg total) by mouth 2 (two) times daily. 08/01/14   Noel GeroldMatt Gentry, MD  ondansetron (ZOFRAN ODT) 4 MG disintegrating tablet 4mg  ODT q4 hours prn nausea/vomit 08/01/14   Noel GeroldMatt Gentry, MD   BP 120/66 mmHg  Pulse 97  Temp(Src) 98.4 F (36.9 C) (Oral)  Resp 16  Ht 5\' 6"  (1.676 m)  Wt 139 lb (63.05 kg)  BMI 22.45 kg/m2  SpO2 100%  LMP 07/29/2014 Physical Exam  Constitutional: She is oriented to person, place, and time. She appears  well-developed and well-nourished.  HENT:  Head: Normocephalic and atraumatic.  Cardiovascular: Normal rate and regular rhythm.   Pulmonary/Chest: Effort normal and breath sounds normal.  Abdominal: Soft. Bowel sounds are normal. There is no tenderness.  Neurological: She is alert and oriented to person, place, and time.  Skin: Skin is warm and dry.  Psychiatric: She has a normal mood and affect.  Nursing note and vitals reviewed.   ED Course  Procedures (including critical care time) Labs Review Labs Reviewed  BASIC METABOLIC PANEL - Abnormal; Notable for the following:    Sodium 131 (*)    Glucose, Bld 108 (*)    Creatinine, Ser 0.46 (*)    Anion gap 1 (*)    All other components within normal limits  URINALYSIS, ROUTINE W REFLEX MICROSCOPIC  PREGNANCY, URINE    Imaging Review No results found.   EKG Interpretation None      MDM   Final diagnoses:  Nausea/vomiting in pregnancy    Pt left with mintz pa with urine pending. Pt has nausea medication by pcp    Teressa LowerVrinda Huyen Perazzo, NP 10/22/14 1621  Geoffery Lyonsouglas Delo, MD 10/25/14 [phone]

## 2014-10-22 NOTE — Discharge Instructions (Signed)
Take the nausea medication that your doctor wrote. Follow up as needed for continued or worsening symptoms Morning Sickness Morning sickness is when you feel sick to your stomach (nauseous) during pregnancy. This nauseous feeling may or may not come with vomiting. It often occurs in the morning but can be a problem any time of day. Morning sickness is most common during the first trimester, but it may continue throughout pregnancy. While morning sickness is unpleasant, it is usually harmless unless you develop severe and continual vomiting (hyperemesis gravidarum). This condition requires more intense treatment.  CAUSES  The cause of morning sickness is not completely known but seems to be related to normal hormonal changes that occur in pregnancy. RISK FACTORS You are at greater risk if you:  Experienced nausea or vomiting before your pregnancy.  Had morning sickness during a previous pregnancy.  Are pregnant with more than one baby, such as twins. TREATMENT  Do not use any medicines (prescription, over-the-counter, or herbal) for morning sickness without first talking to your health care provider. Your health care provider may prescribe or recommend:  Vitamin B6 supplements.  Anti-nausea medicines.  The herbal medicine ginger. HOME CARE INSTRUCTIONS   Only take over-the-counter or prescription medicines as directed by your health care provider.  Taking multivitamins before getting pregnant can prevent or decrease the severity of morning sickness in most women.  Eat a piece of dry toast or unsalted crackers before getting out of bed in the morning.  Eat five or six small meals a day.  Eat dry and bland foods (rice, baked potato). Foods high in carbohydrates are often helpful.  Do not drink liquids with your meals. Drink liquids between meals.  Avoid greasy, fatty, and spicy foods.  Get someone to cook for you if the smell of any food causes nausea and vomiting.  If you feel  nauseous after taking prenatal vitamins, take the vitamins at night or with a snack.  Snack on protein foods (nuts, yogurt, cheese) between meals if you are hungry.  Eat unsweetened gelatins for desserts.  Wearing an acupressure wristband (worn for sea sickness) may be helpful.  Acupuncture may be helpful.  Do not smoke.  Get a humidifier to keep the air in your house free of odors.  Get plenty of fresh air. SEEK MEDICAL CARE IF:   Your home remedies are not working, and you need medicine.  You feel dizzy or lightheaded.  You are losing weight. SEEK IMMEDIATE MEDICAL CARE IF:   You have persistent and uncontrolled nausea and vomiting.  You pass out (faint). MAKE SURE YOU:  Understand these instructions.  Will watch your condition.  Will get help right away if you are not doing well or get worse. Document Released: 09/24/2006 Document Revised: 08/08/2013 Document Reviewed: 01/18/2013 [facility] Patient Information 2015 [place], [place]. This information is not intended to replace advice given to you by your health care provider. Make sure you discuss any questions you have with your health care provider.

## 2014-10-22 NOTE — ED Provider Notes (Signed)
Patient signed out to me by Teressa LowerVrinda Pickering, NP with plan to d/c home if pt's UA negative.    4:45 PM Pt's UA unremarkable for acute pathology.  Pt discharged and encouraged to f/u with OBGYN.    BP 120/66 mmHg  Pulse 97  Temp(Src) 98.4 F (36.9 C) (Oral)  Resp 16  Ht 5\' 6"  (1.676 m)  Wt 139 lb (63.05 kg)  BMI 22.45 kg/m2  SpO2 100%  LMP 07/29/2014  Signed,  Ladona MowJoe Resa Rinks, PA-C 4:49 PM     Ladona MowJoe Anjannette Gauger, PA-C 10/22/14 1649

## 2014-10-22 NOTE — ED Notes (Signed)
Pt sts she is 3 months pregnant and has been vomiting x2 weeks. She sts she saw her ob/gyn but her did not prescribe her anything.

## 2014-10-22 NOTE — ED Notes (Signed)
Late entry-FHT attempted with BS doppler by Cordie Griceuth Marcum RN w/o success-pt reports is [redacted] weeks pregnant

## 2015-01-07 ENCOUNTER — Emergency Department (HOSPITAL_BASED_OUTPATIENT_CLINIC_OR_DEPARTMENT_OTHER)
Admission: EM | Admit: 2015-01-07 | Discharge: 2015-01-07 | Disposition: A | Discharge status: Home / Self Care | Payer: Medicaid Other | Attending: Emergency Medicine | Admitting: Emergency Medicine

## 2015-01-07 ENCOUNTER — Encounter (HOSPITAL_BASED_OUTPATIENT_CLINIC_OR_DEPARTMENT_OTHER): Payer: Self-pay | Admitting: *Deleted

## 2015-01-07 DIAGNOSIS — Z880 Allergy status to penicillin: Secondary | ICD-10-CM | POA: Insufficient documentation

## 2015-01-07 DIAGNOSIS — H109 Unspecified conjunctivitis: Secondary | ICD-10-CM | POA: Insufficient documentation

## 2015-01-07 DIAGNOSIS — Z792 Long term (current) use of antibiotics: Secondary | ICD-10-CM | POA: Diagnosis not present

## 2015-01-07 DIAGNOSIS — H578 Other specified disorders of eye and adnexa: Secondary | ICD-10-CM | POA: Diagnosis present

## 2015-01-07 DIAGNOSIS — Z79899 Other long term (current) drug therapy: Secondary | ICD-10-CM | POA: Diagnosis not present

## 2015-01-07 MED ORDER — MOXIFLOXACIN HCL 0.5 % OP SOLN
1.0000 [drp] | Freq: Three times a day (TID) | OPHTHALMIC | Status: DC
Start: 2015-01-07 — End: 2015-01-09

## 2015-01-07 NOTE — Discharge Instructions (Signed)

## 2015-01-07 NOTE — ED Notes (Signed)
Pt amb to room 7 with quick steady gait in nad. Pt reports irritation and yellow drainage from right eye yesterday, this morning both eyes "had yellow crusty stuff in them".

## 2015-01-07 NOTE — ED Provider Notes (Signed)
CSN: [id number]     Arrival date & time 01/07/15  0745 History   First MD Initiated Contact with Patient 01/07/15 0801     Chief Complaint  Patient presents with  . Eye Drainage      HPI Patient reports drainage from her right eye over the past several days now with drainage of her left eye as well.  She denies fevers and chills.  She reports multiple friends with upper respiratory symptoms recently.  No injury or trauma to her eyes.  No other complaints.  Symptoms are mild.   History reviewed. No pertinent past medical history. History reviewed. No pertinent past surgical history. History reviewed. No pertinent family history. History  Substance Use Topics  . Smoking status: Never Smoker   . Smokeless tobacco: Not on file  . Alcohol Use: No   OB History    Gravida Para Term Preterm AB TAB SAB Ectopic Multiple Living   1              Review of Systems  Constitutional: Negative for fever.  HENT: Negative for congestion and facial swelling.   Eyes: Negative for redness and visual disturbance.  Neurological: Negative for weakness.      Allergies  Amoxicillin  Home Medications   Prior to Admission medications   Medication Sig Start Date End Date Taking? Authorizing Provider  doxycycline (VIBRAMYCIN) 100 MG capsule Take 1 capsule (100 mg total) by mouth 2 (two) times daily. 08/01/14   Mirian MoMatthew Gentry, MD  moxifloxacin (VIGAMOX) 0.5 % ophthalmic solution Place 1 drop into both eyes 3 (three) times daily. 01/07/15 01/12/15  Azalia BilisKevin Goldye Tourangeau, MD  ondansetron (ZOFRAN ODT) 4 MG disintegrating tablet 4mg  ODT q4 hours prn nausea/vomit 08/01/14   Mirian MoMatthew Gentry, MD  Prenatal Vit-Fe Fumarate-FA (PRENATAL MULTIVITAMIN) TABS tablet Take 1 tablet by mouth daily at 12 noon.    Historical Provider, MD   BP 105/55 mmHg  Pulse 72  Temp(Src) 98.4 F (36.9 C) (Oral)  Resp 16  Ht 5\' 6"  (1.676 m)  Wt 146 lb (66.225 kg)  BMI 23.58 kg/m2  SpO2 100%  LMP 07/29/2014 Physical Exam   Constitutional: She is oriented to person, place, and time. She appears well-developed and well-nourished.  HENT:  Head: Normocephalic.  Eyes: EOM are normal.  Erythema of her conduct our bilaterally.  Extraocular movements are intact.  No visual changes  Neck: Normal range of motion.  Pulmonary/Chest: Effort normal.  Abdominal: She exhibits no distension.  Musculoskeletal: Normal range of motion.  Neurological: She is alert and oriented to person, place, and time.  Psychiatric: She has a normal mood and affect.  Nursing note and vitals reviewed.   ED Course  Procedures (including critical care time) Labs Review Labs Reviewed - No data to display  Imaging Review No results found.   EKG Interpretation None      MDM   Final diagnoses:  Bilateral conjunctivitis        Azalia BilisKevin Keron Neenan, MD 01/07/15 [phone]

## 2015-01-09 ENCOUNTER — Emergency Department (HOSPITAL_BASED_OUTPATIENT_CLINIC_OR_DEPARTMENT_OTHER)
Admission: EM | Admit: 2015-01-09 | Discharge: 2015-01-09 | Disposition: A | Discharge status: Home / Self Care | Payer: Medicaid Other | Attending: Emergency Medicine | Admitting: Emergency Medicine

## 2015-01-09 ENCOUNTER — Encounter (HOSPITAL_BASED_OUTPATIENT_CLINIC_OR_DEPARTMENT_OTHER): Payer: Self-pay | Admitting: *Deleted

## 2015-01-09 DIAGNOSIS — O99352 Diseases of the nervous system complicating pregnancy, second trimester: Secondary | ICD-10-CM | POA: Insufficient documentation

## 2015-01-09 DIAGNOSIS — Z3A21 21 weeks gestation of pregnancy: Secondary | ICD-10-CM | POA: Insufficient documentation

## 2015-01-09 DIAGNOSIS — Z880 Allergy status to penicillin: Secondary | ICD-10-CM | POA: Insufficient documentation

## 2015-01-09 DIAGNOSIS — H109 Unspecified conjunctivitis: Secondary | ICD-10-CM | POA: Diagnosis not present

## 2015-01-09 DIAGNOSIS — Z792 Long term (current) use of antibiotics: Secondary | ICD-10-CM | POA: Insufficient documentation

## 2015-01-09 DIAGNOSIS — Z79899 Other long term (current) drug therapy: Secondary | ICD-10-CM | POA: Diagnosis not present

## 2015-01-09 NOTE — ED Notes (Signed)
D/c home with instructions to f/u with eye doctor

## 2015-01-09 NOTE — Discharge Instructions (Signed)
Stop using the vigamox eye drops.    Conjunctivitis Conjunctivitis is commonly called "pink eye." Conjunctivitis can be caused by bacterial or viral infection, allergies, or injuries. There is usually redness of the lining of the eye, itching, discomfort, and sometimes discharge. There may be deposits of matter along the eyelids. A viral infection usually causes a watery discharge, while a bacterial infection causes a yellowish, thick discharge. Pink eye is very contagious and spreads by direct contact. You may be given antibiotic eyedrops as part of your treatment. Before using your eye medicine, remove all drainage from the eye by washing gently with warm water and cotton balls. Continue to use the medication until you have awakened 2 mornings in a row without discharge from the eye. Do not rub your eye. This increases the irritation and helps spread infection. Use separate towels from other household members. Wash your hands with soap and water before and after touching your eyes. Use cold compresses to reduce pain and sunglasses to relieve irritation from light. Do not wear contact lenses or wear eye makeup until the infection is gone. SEEK MEDICAL CARE IF:   Your symptoms are not better after 3 days of treatment.  You have increased pain or trouble seeing.  The outer eyelids become very red or swollen. Document Released: 09/10/2004 Document Revised: 10/26/2011 Document Reviewed: 08/03/2005 [facility] HospitalExitCare Patient Information 2015 [place]ExitCare, [place]LLC. This information is not intended to replace advice given to you by your health care provider. Make sure you discuss any questions you have with your health care provider.

## 2015-01-09 NOTE — ED Provider Notes (Signed)
CSN: [id number]     Arrival date & time 01/09/15  0940 History   First MD Initiated [id number] with Patient 01/09/15 [phone]     Chief Complaint  Patient presents with  . Eye Pain     Patient is a 24 y.o. female presenting with eye pain. The history is provided by the patient. No language interpreter was used.  Eye Pain   Ms. Tabitha Robinson presents for evaluation of eye pain and drainage. She's had 3 days of itching in her eyes. They are red and feels swollen. She is having watery and yellow drainage. The right eye started first followed by the left. She denies any fevers. She has pain with moving her eyes but her eyes themselves do not hurt when she is just sitting there. She feels like things are worsening since she started eyedrops 2 days ago. She is taking Vigamox eyedrops. She has sometimes increased blurry vision. She is [redacted] weeks pregnant with a twin gestation. Symptoms are moderate, constant, worsening. No fevers, no systemic symptoms. Patient does not wear contact lenses or glasses.  No past medical history on file. History reviewed. No pertinent past surgical history. No family history on file. History  Substance Use Topics  . Smoking status: Never Smoker   . Smokeless tobacco: Not on file  . Alcohol Use: No   OB History    Gravida Para Term Preterm AB TAB SAB Ectopic Multiple Living   1              Review of Systems  Eyes: Positive for pain.  All other systems reviewed and are negative.     Allergies  Amoxicillin  Home Medications   Prior to Admission medications   Medication Sig Start Date End Date Taking? Authorizing Provider  moxifloxacin (VIGAMOX) 0.5 % ophthalmic solution Place 1 drop into both eyes 3 (three) times daily. 01/07/15 01/12/15 Yes Azalia BilisKevin Campos, MD  Prenatal Vit-Fe Fumarate-FA (PRENATAL MULTIVITAMIN) TABS tablet Take 1 tablet by mouth daily at 12 noon.   Yes Historical Provider, MD  doxycycline (VIBRAMYCIN) 100 MG capsule Take 1 capsule (100 mg total) by mouth  2 (two) times daily. 08/01/14   Mirian MoMatthew Gentry, MD  ondansetron (ZOFRAN ODT) 4 MG disintegrating tablet 4mg  ODT q4 hours prn nausea/vomit 08/01/14   Mirian MoMatthew Gentry, MD   BP 113/59 mmHg  Pulse 93  Temp(Src) 98.6 F (37 C) (Oral)  Resp 18  Ht 5\' 6"  (1.676 m)  Wt 146 lb (66.225 kg)  BMI 23.58 kg/m2  SpO2 100%  LMP 07/29/2014 Physical Exam  Constitutional: She is oriented to person, place, and time. She appears well-developed and well-nourished.  HENT:  Head: Normocephalic and atraumatic.  Right Ear: External ear normal.  Left Ear: External ear normal.  Eyes: Pupils are equal, round, and reactive to light.  Mild to moderate conjunctival injection. Mild lid edema bilaterally. Small amount of watery, thin discharge. Pupils midsize and reactive bilaterally. EOMI. No proptosis. Globes are soft  Cardiovascular: Normal rate and regular rhythm.   Pulmonary/Chest: Effort normal. No respiratory distress.  Abdominal:  Gravid uterus  Musculoskeletal: She exhibits no edema.  Neurological: She is alert and oriented to person, place, and time. No cranial nerve deficit.  Skin: Skin is warm and dry.  Psychiatric: She has a normal mood and affect. Her behavior is normal.  Nursing note and vitals reviewed.   ED Course  Procedures (including critical care time) Labs Review Labs Reviewed - No data to display  Imaging Review No results  found.   EKG Interpretation None      MDM   Final diagnoses:  Bilateral conjunctivitis   patient here with eye drainage. Examination is consistent with viral conjunctivitis, question some reaction irritation related to an about eyedrops. Discussed discontinuing the eyedrops and outpatient ophthalmology follow-up. Recommend saline drops when necessary, warm compresses. Examination is not consistent with bacterial conjunctivitis, glaucoma, iritis.  Tilden Fossa, MD 01/09/15 1339

## 2015-01-09 NOTE — ED Notes (Signed)
Seen here and treated Monday for pink eye- states drops hurt and her symptoms are not improving

## 2015-06-05 IMAGING — CT CT ABD-PELV W/ CM
2 of 5 series · 16 of 46 positions shown, 18 images · IV contrast (APPLIED)
Comparison: None.

CLINICAL DATA: Lower abdominal pain

EXAM:
CT ABDOMEN AND PELVIS WITH CONTRAST
TECHNIQUE: Multidetector CT imaging of the abdomen and pelvis was performed
using the standard protocol following bolus administration of
intravenous contrast.
CONTRAST:  25mL OMNIPAQUE IOHEXOL 300 MG/ML SOLN, 100mL OMNIPAQUE
IOHEXOL 300 MG/ML SOLN

[Series 2: abd/pelvis 5.0 b31f · axial · 0.63mm/px · z∈[+810,+1225]mm · 13 of 93 slices shown, 15 images]
[im 5/93  soft-tissue]
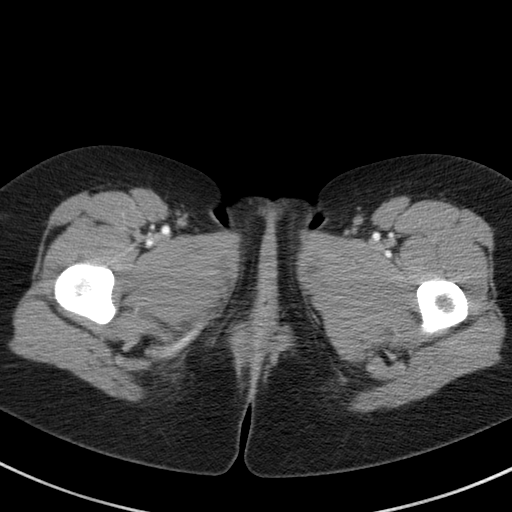
[im 5/93  bone]
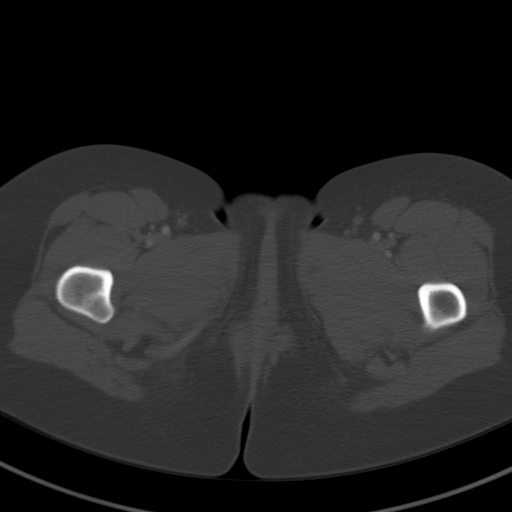
[im 15/93  soft-tissue]
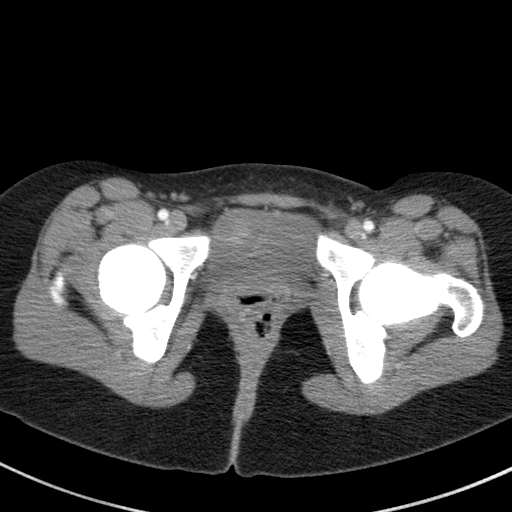
[im 20/93  soft-tissue]
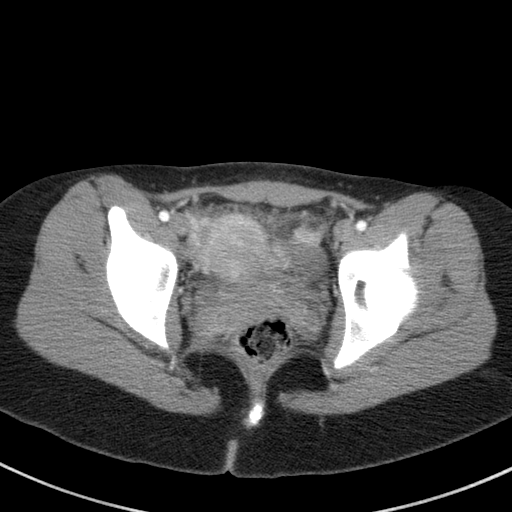
[im 25/93  soft-tissue]
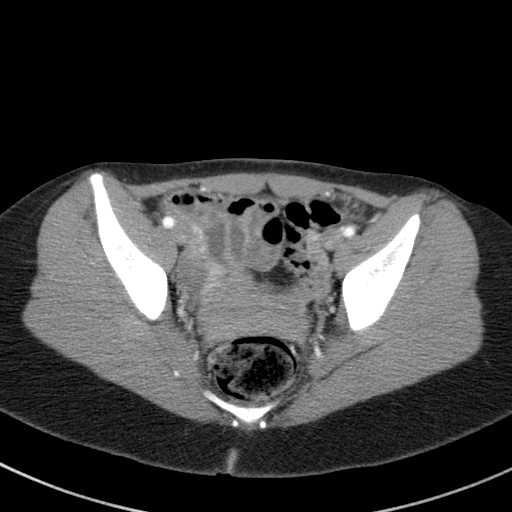
[im 34/93  soft-tissue]
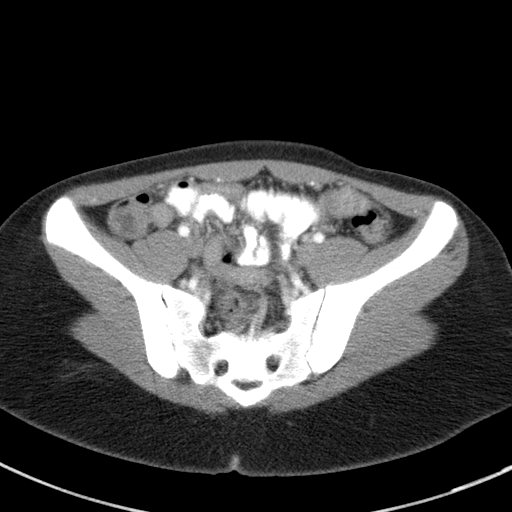
[im 39/93  soft-tissue]
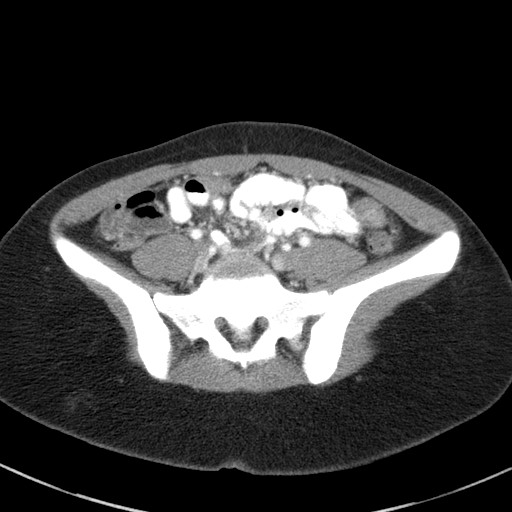
[im 49/93  soft-tissue]
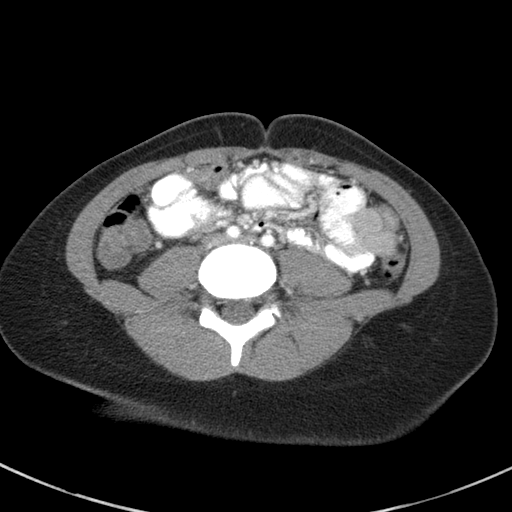
[im 54/93  soft-tissue]
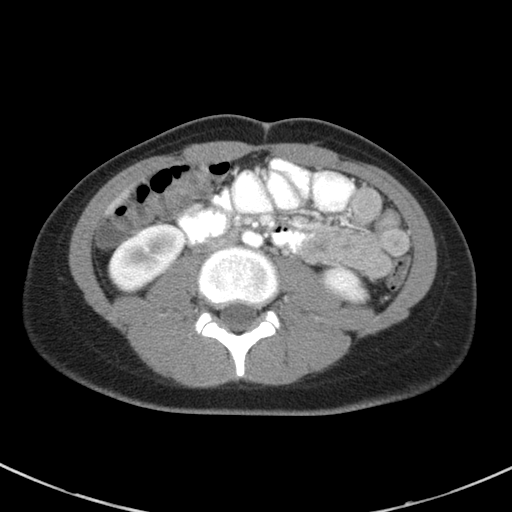
[im 59/93  soft-tissue]
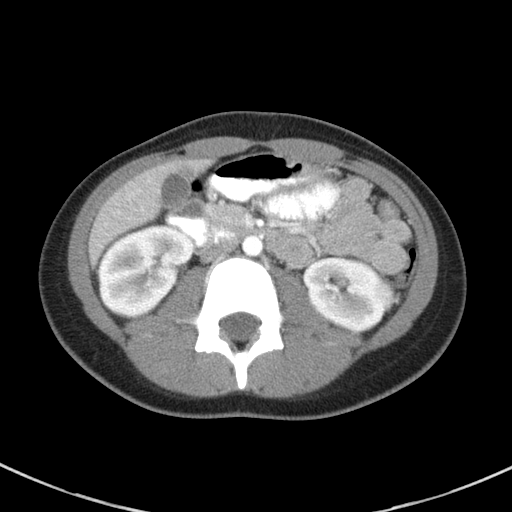
[im 59/93  bone]
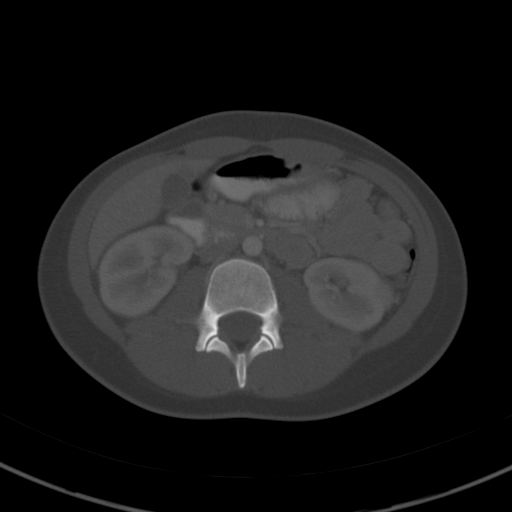
[im 68/93  soft-tissue]
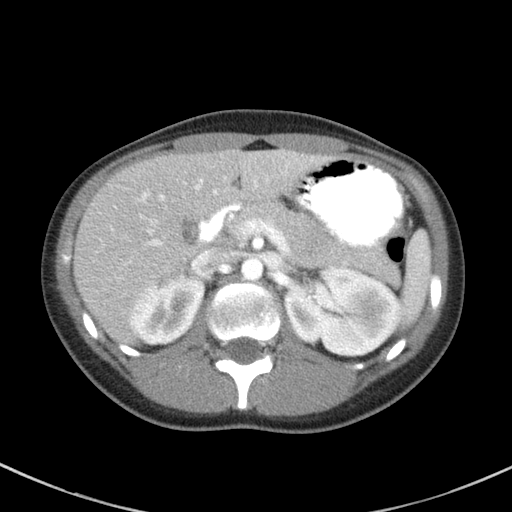
[im 73/93  soft-tissue]
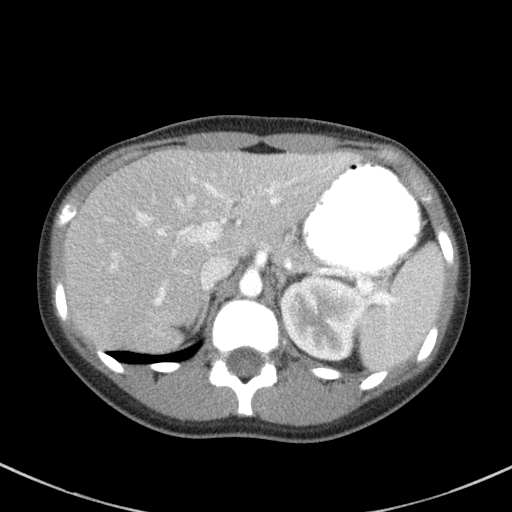
[im 78/93  soft-tissue]
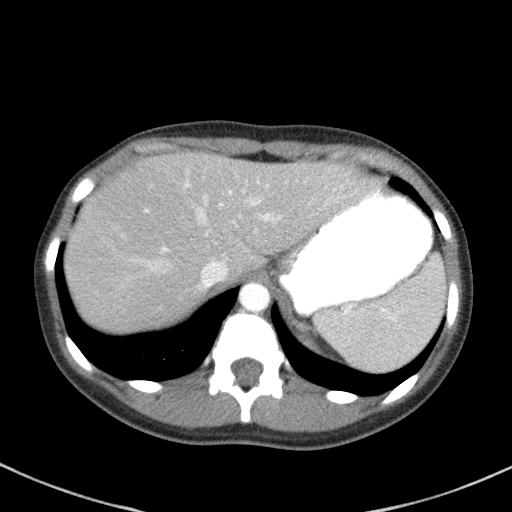
[im 88/93  soft-tissue]
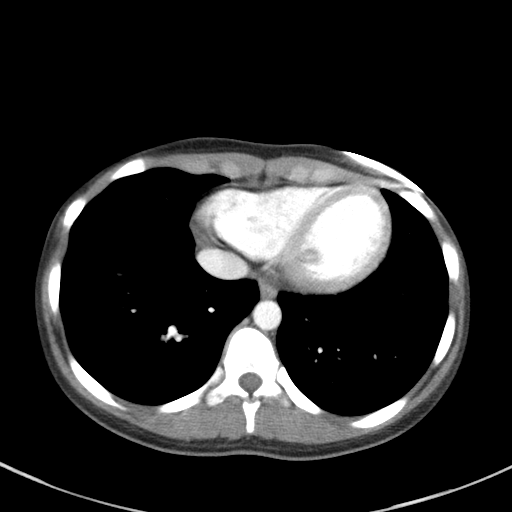

[Series 5: abd/pelvis 3.0 coronal · coronal · 0.61mm/px · 3 of 77 slices shown]
[im 26/77  soft-tissue]
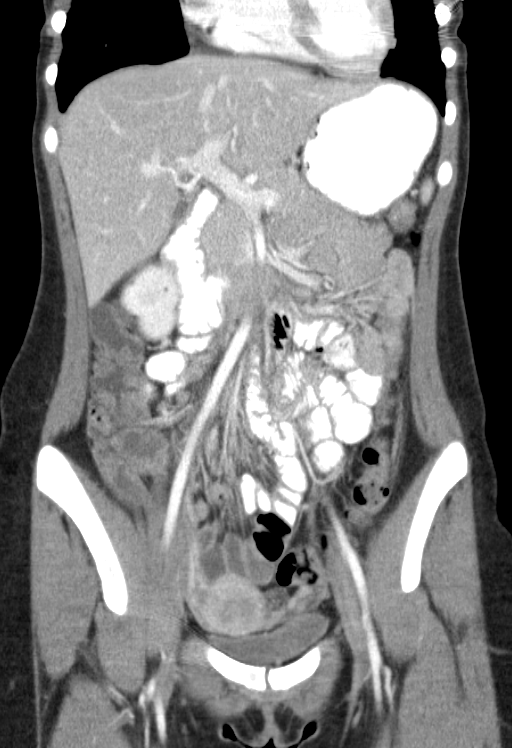
[im 34/77  soft-tissue]
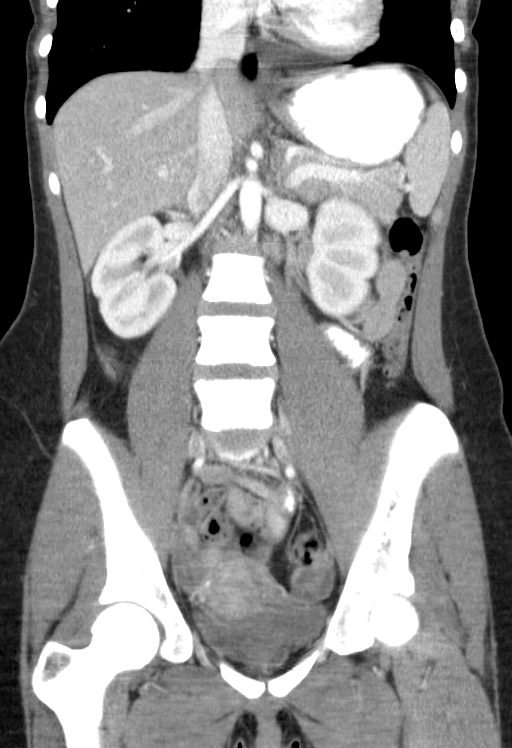
[im 43/77  soft-tissue]
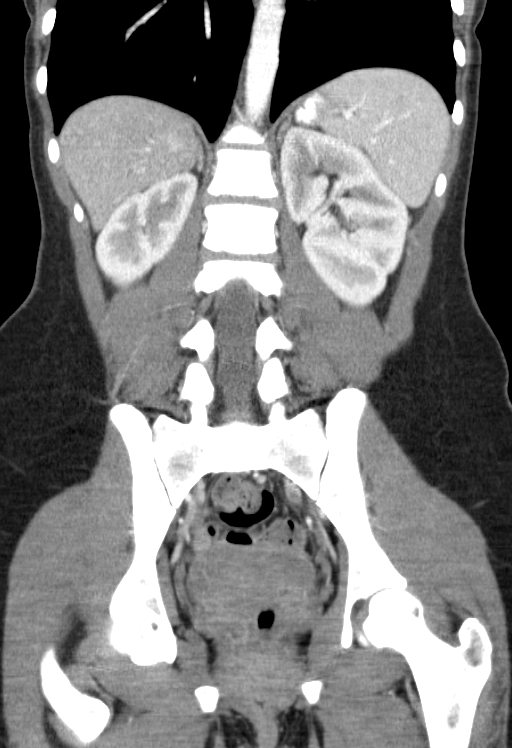

[16 of 46 positions shown; findings below may reference images not displayed]

FINDINGS: Lung bases are clear.

Liver and spleen are normal. Gallbladder and bile ducts are normal.
Pancreas is normal.

15 mm cyst on the left lateral renal cortex. 5 mm adjacent cyst. No
renal obstruction or mass. No urinary tract calculi. Urinary bladder
is normal.

Negative for bowel obstruction or bowel thickening. Normal appendix.
Negative for diverticulitis.

Anteverted uterus. Normal ovaries and uterus. No free fluid.
Negative for mass or adenopathy. No acute spinal abnormality.
IMPRESSION: Negative

## 2016-07-15 ENCOUNTER — Encounter (HOSPITAL_BASED_OUTPATIENT_CLINIC_OR_DEPARTMENT_OTHER): Payer: Self-pay | Admitting: *Deleted

## 2016-07-15 ENCOUNTER — Emergency Department (HOSPITAL_BASED_OUTPATIENT_CLINIC_OR_DEPARTMENT_OTHER)
Admission: EM | Admit: 2016-07-15 | Discharge: 2016-07-15 | Disposition: A | Discharge status: Home / Self Care | Payer: Medicaid Other | Attending: Emergency Medicine | Admitting: Emergency Medicine

## 2016-07-15 DIAGNOSIS — Y929 Unspecified place or not applicable: Secondary | ICD-10-CM | POA: Insufficient documentation

## 2016-07-15 DIAGNOSIS — Y999 Unspecified external cause status: Secondary | ICD-10-CM | POA: Insufficient documentation

## 2016-07-15 DIAGNOSIS — Y939 Activity, unspecified: Secondary | ICD-10-CM | POA: Diagnosis not present

## 2016-07-15 DIAGNOSIS — S50811A Abrasion of right forearm, initial encounter: Secondary | ICD-10-CM | POA: Insufficient documentation

## 2016-07-15 DIAGNOSIS — S59911A Unspecified injury of right forearm, initial encounter: Secondary | ICD-10-CM | POA: Diagnosis present

## 2016-07-15 DIAGNOSIS — S40811A Abrasion of right upper arm, initial encounter: Secondary | ICD-10-CM

## 2016-07-15 MED ORDER — ACETAMINOPHEN 500 MG PO TABS
1000.0000 mg | ORAL_TABLET | Freq: Once | ORAL | Status: AC
  Administered 2016-07-15: 1000 mg via ORAL
  Filled 2016-07-15: qty 2
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
## 2016-07-15 NOTE — ED Notes (Signed)
ED Provider at bedside. 

## 2017-04-01 ENCOUNTER — Telehealth: Payer: Self-pay | Admitting: Internal Medicine

## 2017-04-01 NOTE — Telephone Encounter (Signed)
Spoke with pt. She has been scheduled to see MW on 04/02/2017 at 1:30pm. Nothing further was needed.

## 2017-04-02 ENCOUNTER — Ambulatory Visit (INDEPENDENT_AMBULATORY_CARE_PROVIDER_SITE_OTHER): Payer: Commercial Managed Care - PPO | Admitting: Internal Medicine

## 2017-04-02 ENCOUNTER — Encounter: Payer: Self-pay | Admitting: Internal Medicine

## 2017-04-02 ENCOUNTER — Ambulatory Visit (INDEPENDENT_AMBULATORY_CARE_PROVIDER_SITE_OTHER)
Admission: RE | Admit: 2017-04-02 | Discharge: 2017-04-02 | Disposition: A | Discharge status: Home / Self Care | Payer: Commercial Managed Care - PPO | Source: Ambulatory Visit | Attending: Internal Medicine | Admitting: Internal Medicine

## 2017-04-02 ENCOUNTER — Other Ambulatory Visit (INDEPENDENT_AMBULATORY_CARE_PROVIDER_SITE_OTHER): Payer: Commercial Managed Care - PPO

## 2017-04-02 VITALS — BP 124/68 | HR 75 | Ht 66.0 in | Wt 155.0 lb

## 2017-04-02 DIAGNOSIS — R0789 Other chest pain: Secondary | ICD-10-CM

## 2017-04-02 LAB — CBC WITH DIFFERENTIAL/PLATELET
BASOS PCT: 0.4 % (ref 0.0–3.0)
Basophils Absolute: 0 10*3/uL (ref 0.0–0.1)
Eosinophils Absolute: 0.1 10*3/uL (ref 0.0–0.7)
Eosinophils Relative: 1.4 % (ref 0.0–5.0)
HCT: 36.4 % (ref 36.0–46.0)
Hemoglobin: 12 g/dL (ref 12.0–15.0)
Lymphocytes Relative: 38.2 % (ref 12.0–46.0)
Lymphs Abs: 2.9 10*3/uL (ref 0.7–4.0)
MCHC: 33.1 g/dL (ref 30.0–36.0)
MCV: 88.8 fl (ref 78.0–100.0)
MONOS PCT: 6.8 % (ref 3.0–12.0)
Monocytes Absolute: 0.5 10*3/uL (ref 0.1–1.0)
NEUTROS PCT: 53.2 % (ref 43.0–77.0)
Neutro Abs: 4.1 10*3/uL (ref 1.4–7.7)
Platelets: 294 10*3/uL (ref 150.0–400.0)
RBC: 4.09 Mil/uL (ref 3.87–5.11)
RDW: 13.3 % (ref 11.5–15.5)
WBC: 7.6 10*3/uL (ref 4.0–10.5)

## 2017-04-02 LAB — BASIC METABOLIC PANEL
BUN: 12 mg/dL (ref 6–23)
CO2: 27 meq/L (ref 19–32)
Calcium: 9.9 mg/dL (ref 8.4–10.5)
Chloride: 106 mEq/L (ref 96–112)
Creatinine, Ser: 0.87 mg/dL (ref 0.40–1.20)
GFR: 84.58 mL/min (ref >60.00)
GLUCOSE: 93 mg/dL (ref 70–99)
Potassium: 4.2 mEq/L (ref 3.5–5.1)
Sodium: 138 mEq/L (ref 135–145)

## 2017-04-02 LAB — URINALYSIS
Bilirubin Urine: NEGATIVE
Hgb urine dipstick: NEGATIVE
Leukocytes, UA: NEGATIVE
NITRITE: NEGATIVE
PH: 6 (ref 5.0–8.0)
Specific Gravity, Urine: 1.025 (ref 1.000–1.030)
TOTAL PROTEIN, URINE-UPE24: NEGATIVE
Urine Glucose: NEGATIVE
Urobilinogen, UA: 0.2 (ref 0.0–1.0)

## 2017-04-02 LAB — HEPATIC FUNCTION PANEL
ALT: 8 U/L (ref 0–35)
AST: 11 U/L (ref 0–37)
Albumin: 4 g/dL (ref 3.5–5.2)
Alkaline Phosphatase: 50 U/L (ref 39–117)
Bilirubin, Direct: 0.1 mg/dL (ref 0.0–0.3)
Total Bilirubin: 0.3 mg/dL (ref 0.2–1.2)
Total Protein: 6.9 g/dL (ref 6.0–8.3)

## 2017-04-02 LAB — SEDIMENTATION RATE: SED RATE: 26 mm/h — AB (ref 0–20)
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
## 2017-04-02 NOTE — Patient Instructions (Addendum)
Classic subdiaphragmatic pain pattern suggests ibs:  Stereotypical,  very limited distribution of pain locations, daytime, not exacerbated by exercise,  associated with generalized abd bloating, not present supine due to the dome effect of the diaphragm is  canceled in that position.    Treatment consists of avoiding foods that cause gas (especially beans , [place] food, boiled eggs, and raw vegetables like spinach and salads)  and citrucel 1 heaping tsp twice daily with a large glass of water.  Pain should improve w/in 2 weeks and if not then consider further GI work up.   Please remember to go to the lab and x-ray department downstairs in the basement  for your tests - we will call you with the results when they are available. Next step:  CT abd /pelvis if no better on this rx
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
## 2017-04-05 ENCOUNTER — Telehealth: Payer: Self-pay | Admitting: Internal Medicine

## 2017-04-05 NOTE — Progress Notes (Signed)
Spoke with pt and notified of results per Dr. Wert. Pt verbalized understanding and denied any questions. 

## 2017-04-05 NOTE — Telephone Encounter (Signed)
Notes recorded by Nyoka Cowden, MD on 04/02/2017 at 5:08 PM EDT Call pt: Reviewed cxr and no acute change so no change in recommendations made at ov ------------------------------------ lmtcb x1 for pt.

## 2017-04-05 NOTE — Telephone Encounter (Signed)
Pt returning call about results [phone]

## 2017-04-05 NOTE — Telephone Encounter (Signed)
Spoke with pt. She is aware of results. Nothing further was needed.  

## 2017-05-18 ENCOUNTER — Institutional Professional Consult (permissible substitution): Payer: Medicaid Other | Admitting: Internal Medicine

## 2017-08-02 ENCOUNTER — Emergency Department (HOSPITAL_BASED_OUTPATIENT_CLINIC_OR_DEPARTMENT_OTHER)
Admission: EM | Admit: 2017-08-02 | Discharge: 2017-08-02 | Disposition: A | Discharge status: Home / Self Care | Payer: No Typology Code available for payment source | Attending: Emergency Medicine | Admitting: Emergency Medicine

## 2017-08-02 ENCOUNTER — Encounter (HOSPITAL_BASED_OUTPATIENT_CLINIC_OR_DEPARTMENT_OTHER): Payer: Self-pay | Admitting: *Deleted

## 2017-08-02 ENCOUNTER — Other Ambulatory Visit: Payer: Self-pay

## 2017-08-02 DIAGNOSIS — B9789 Other viral agents as the cause of diseases classified elsewhere: Secondary | ICD-10-CM | POA: Insufficient documentation

## 2017-08-02 DIAGNOSIS — Z79899 Other long term (current) drug therapy: Secondary | ICD-10-CM | POA: Diagnosis not present

## 2017-08-02 DIAGNOSIS — F1210 Cannabis abuse, uncomplicated: Secondary | ICD-10-CM | POA: Diagnosis not present

## 2017-08-02 DIAGNOSIS — R110 Nausea: Secondary | ICD-10-CM | POA: Diagnosis not present

## 2017-08-02 DIAGNOSIS — R0981 Nasal congestion: Secondary | ICD-10-CM | POA: Diagnosis not present

## 2017-08-02 DIAGNOSIS — J069 Acute upper respiratory infection, unspecified: Secondary | ICD-10-CM | POA: Diagnosis not present

## 2017-08-02 DIAGNOSIS — R05 Cough: Secondary | ICD-10-CM | POA: Diagnosis present

## 2017-08-02 MED ORDER — FLUTICASONE PROPIONATE 50 MCG/ACT NA SUSP: 1.0000 | Freq: Every day | NASAL | 0 refills | Status: DC

## 2017-08-02 MED ORDER — BENZONATATE 100 MG PO CAPS: 100.0000 mg | ORAL_CAPSULE | Freq: Three times a day (TID) | ORAL | 0 refills | Status: DC

## 2017-08-02 MED FILL — BENZONATATE 100 MG CAPSULE: 100 | 7 days supply | Qty: 21 | Fill #0

## 2017-08-02 MED FILL — FLUTICASONE PROP 50 MCG SPR: 50 | 60 days supply | Qty: 16 | Fill #0

## 2017-08-02 NOTE — ED Triage Notes (Signed)
Cough, congestion, sneezing and nausea.

## 2017-08-02 NOTE — ED Provider Notes (Signed)
[facility] EMERGENCY DEPARTMENT Provider Note   CSN: [id number] Arrival date & time: 08/02/17  1142     History   Chief Complaint Chief Complaint  Patient presents with  . Cough    HPI Tabitha Robinson is a 26 y.o. female.  Patient presents to the emergency department with a chief complaint of cough and cold symptoms.  She reports feeling sick for the past 3 days.  She denies any fevers, or chills.  She denies any new chest pain, shortness of breath, or abdominal pain.  Denies any dysuria.  She states that she did feel slightly nauseated earlier today.  She reports sinus congestion.  She has not tried taking anything for symptoms.  She denies any modifying factors.   The history is provided by the patient. No language interpreter was used.    History reviewed. No pertinent past medical history.  Patient Active Problem List   Diagnosis Date Noted  . Chest pain, non-cardiac 04/02/2017    History reviewed. No pertinent surgical history.  OB History    Gravida Para Term Preterm AB Living   1             SAB TAB Ectopic Multiple Live Births                   Home Medications    Prior to Admission medications   Medication Sig Start Date End Date Taking? Authorizing Provider  norethindrone-ethinyl estradiol (JUNEL FE,GILDESS FE,LOESTRIN FE) 1-20 MG-MCG tablet Take 1 tablet by mouth daily.    [provider]    Family History No family history on file.  Social History Social History   Tobacco Use  . Smoking status: Never Smoker  . Smokeless tobacco: Never Used  Substance Use Topics  . Alcohol use: No  . Drug use: Yes    Types: Marijuana     Allergies   Amoxicillin   Review of Systems Review of Systems  All other systems reviewed and are negative.    Physical Exam Updated Vital Signs BP 122/82   Pulse 75   Temp 98.2 F (36.8 C) (Oral)   Resp 18   Ht 5\' 6"  (1.676 m)   Wt 68 kg (150 lb)   LMP 07/13/2017   SpO2 100%   BMI  24.21 kg/m   Physical Exam Physical Exam  Constitutional: Pt  is oriented to person, place, and time. Appears well-developed and well-nourished. No distress.  HENT:  Head: Normocephalic and atraumatic.  Right Ear: Tympanic membrane, external ear and ear canal normal.  Left Ear: Tympanic membrane, external ear and ear canal normal.  Nose: Mucosal edema and moderate rhinorrhea present. No epistaxis. Right sinus exhibits no maxillary sinus tenderness and no frontal sinus tenderness. Left sinus exhibits no maxillary sinus tenderness and no frontal sinus tenderness.  Mouth/Throat: Uvula is midline and mucous membranes are normal. Mucous membranes are not pale and not cyanotic. No oropharyngeal exudate, posterior oropharyngeal edema, posterior oropharyngeal erythema or tonsillar abscesses.  Eyes: Conjunctivae are normal. Pupils are equal, round, and reactive to light.  Neck: Normal range of motion and full passive range of motion without pain.  Cardiovascular: Normal rate and intact distal pulses.   Pulmonary/Chest: Effort normal and breath sounds normal. No stridor.  Clear and equal breath sounds without focal wheezes, rhonchi, rales  Abdominal: Soft. Bowel sounds are normal. There is no tenderness.  Musculoskeletal: Normal range of motion.  Lymphadenopathy:    Pthas no cervical adenopathy.  Neurological: Pt  is alert and oriented to person, place, and time.  Skin: Skin is warm and dry. No rash noted. Pt is not diaphoretic.  Psychiatric: Normal mood and affect.  Nursing note and vitals reviewed.    ED Treatments / Results  Labs (all labs ordered are listed, but only abnormal results are displayed) Labs Reviewed - No data to display  EKG  EKG Interpretation None       Radiology No results found.  Procedures Procedures (including critical care time)  Medications Ordered in ED Medications - No data to display   Initial Impression / Assessment and Plan / ED Course  I have  reviewed the triage vital signs and the nursing notes.  Pertinent labs & imaging results that were available during my care of the patient were reviewed by me and considered in my medical decision making (see chart for details).     Patients symptoms are consistent with URI, likely viral etiology. Discussed that antibiotics are not indicated for viral infections. Pt will be discharged with symptomatic treatment.  Verbalizes understanding and is agreeable with plan. Pt is hemodynamically stable & in NAD prior to dc.   Final Clinical Impressions(s) / ED Diagnoses   Final diagnoses:  Viral URI with cough    ED Discharge Orders        Ordered    benzonatate (TESSALON) 100 MG capsule  Every 8 hours     08/02/17 1403    fluticasone (FLONASE) 50 MCG/ACT nasal spray  Daily     08/02/17 1403       Roxy HorsemanBrowning, Ngozi Alvidrez, PA-C 08/02/17 1404    Tegeler, Tabitha Brimhristopher J, MD 08/02/17 1730

## 2018-02-04 IMAGING — DX DG CHEST 2V
2 series · 2 of 2 positions shown · non-contrast
Comparison: None.

CLINICAL DATA: 26 y/o F; right lower chest pain and intermittent
shortness of breath.

EXAM:
CHEST  2 VIEW

[chest pa]
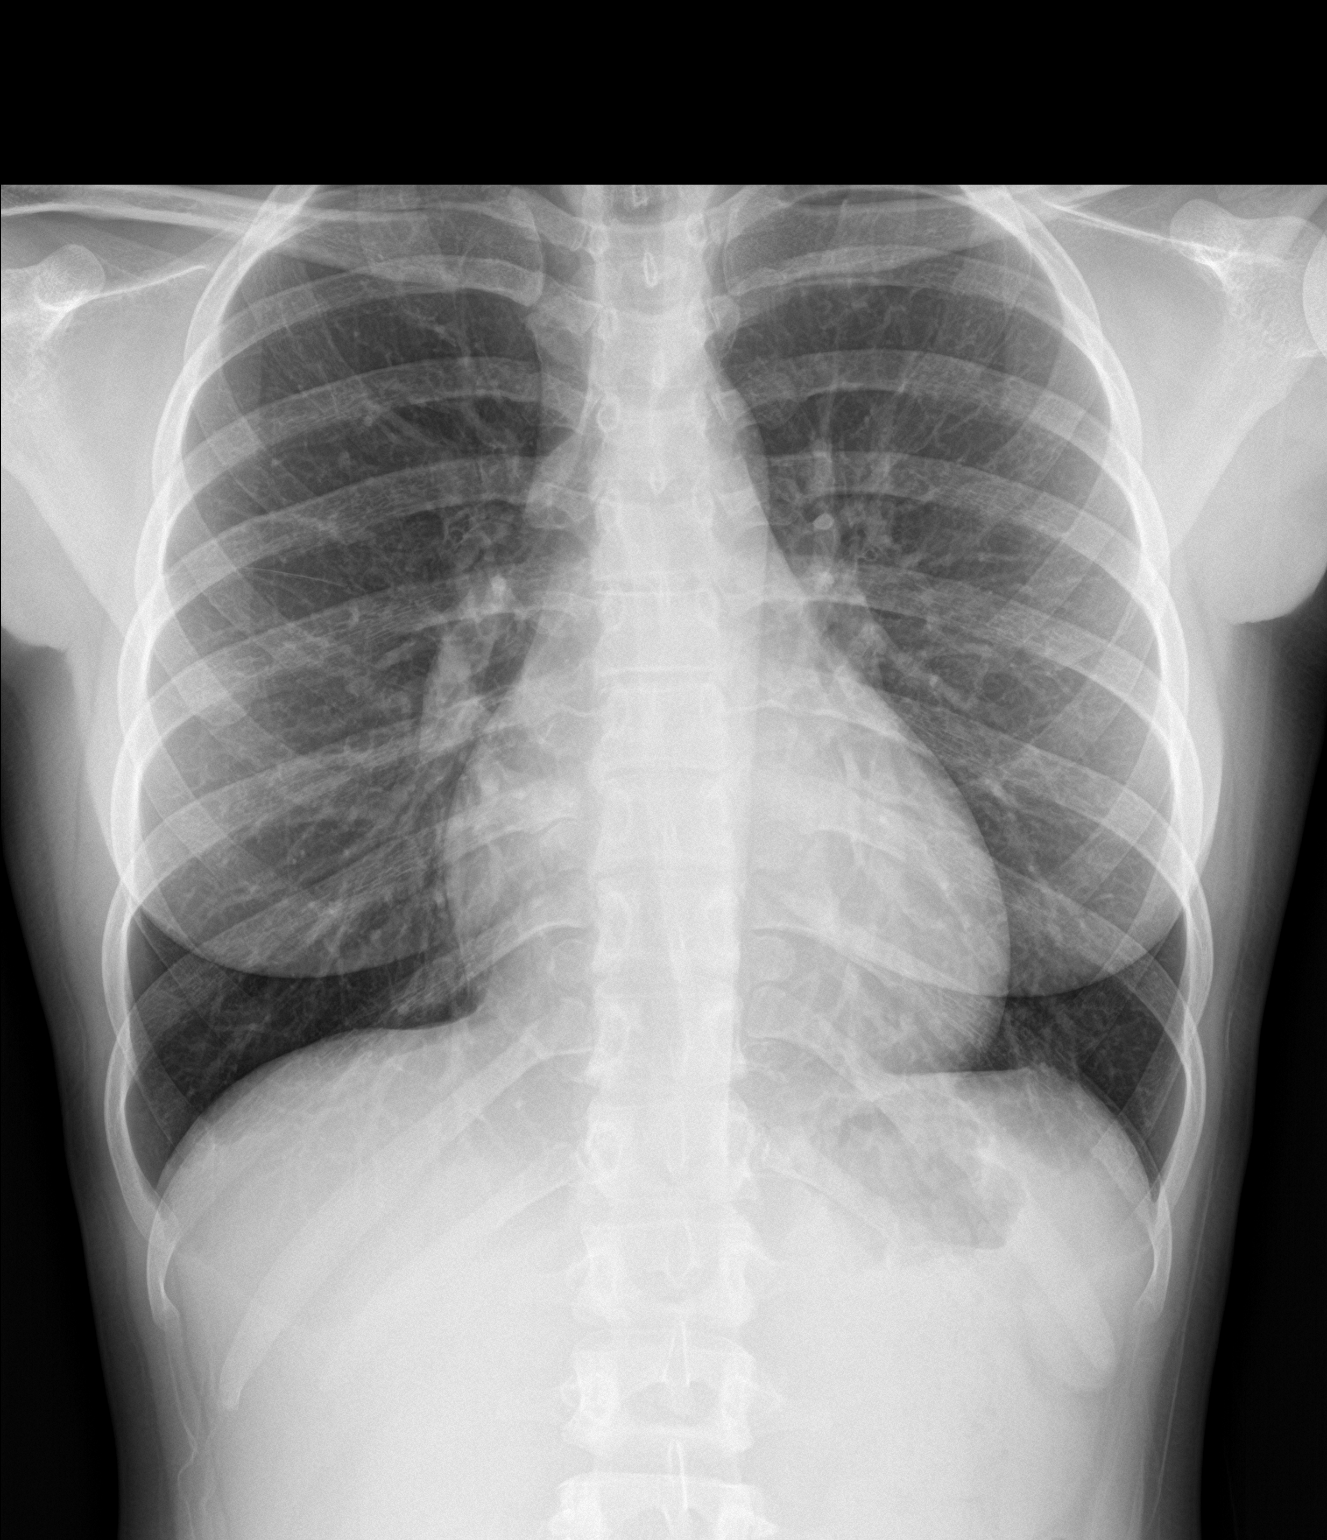

[chest lat]
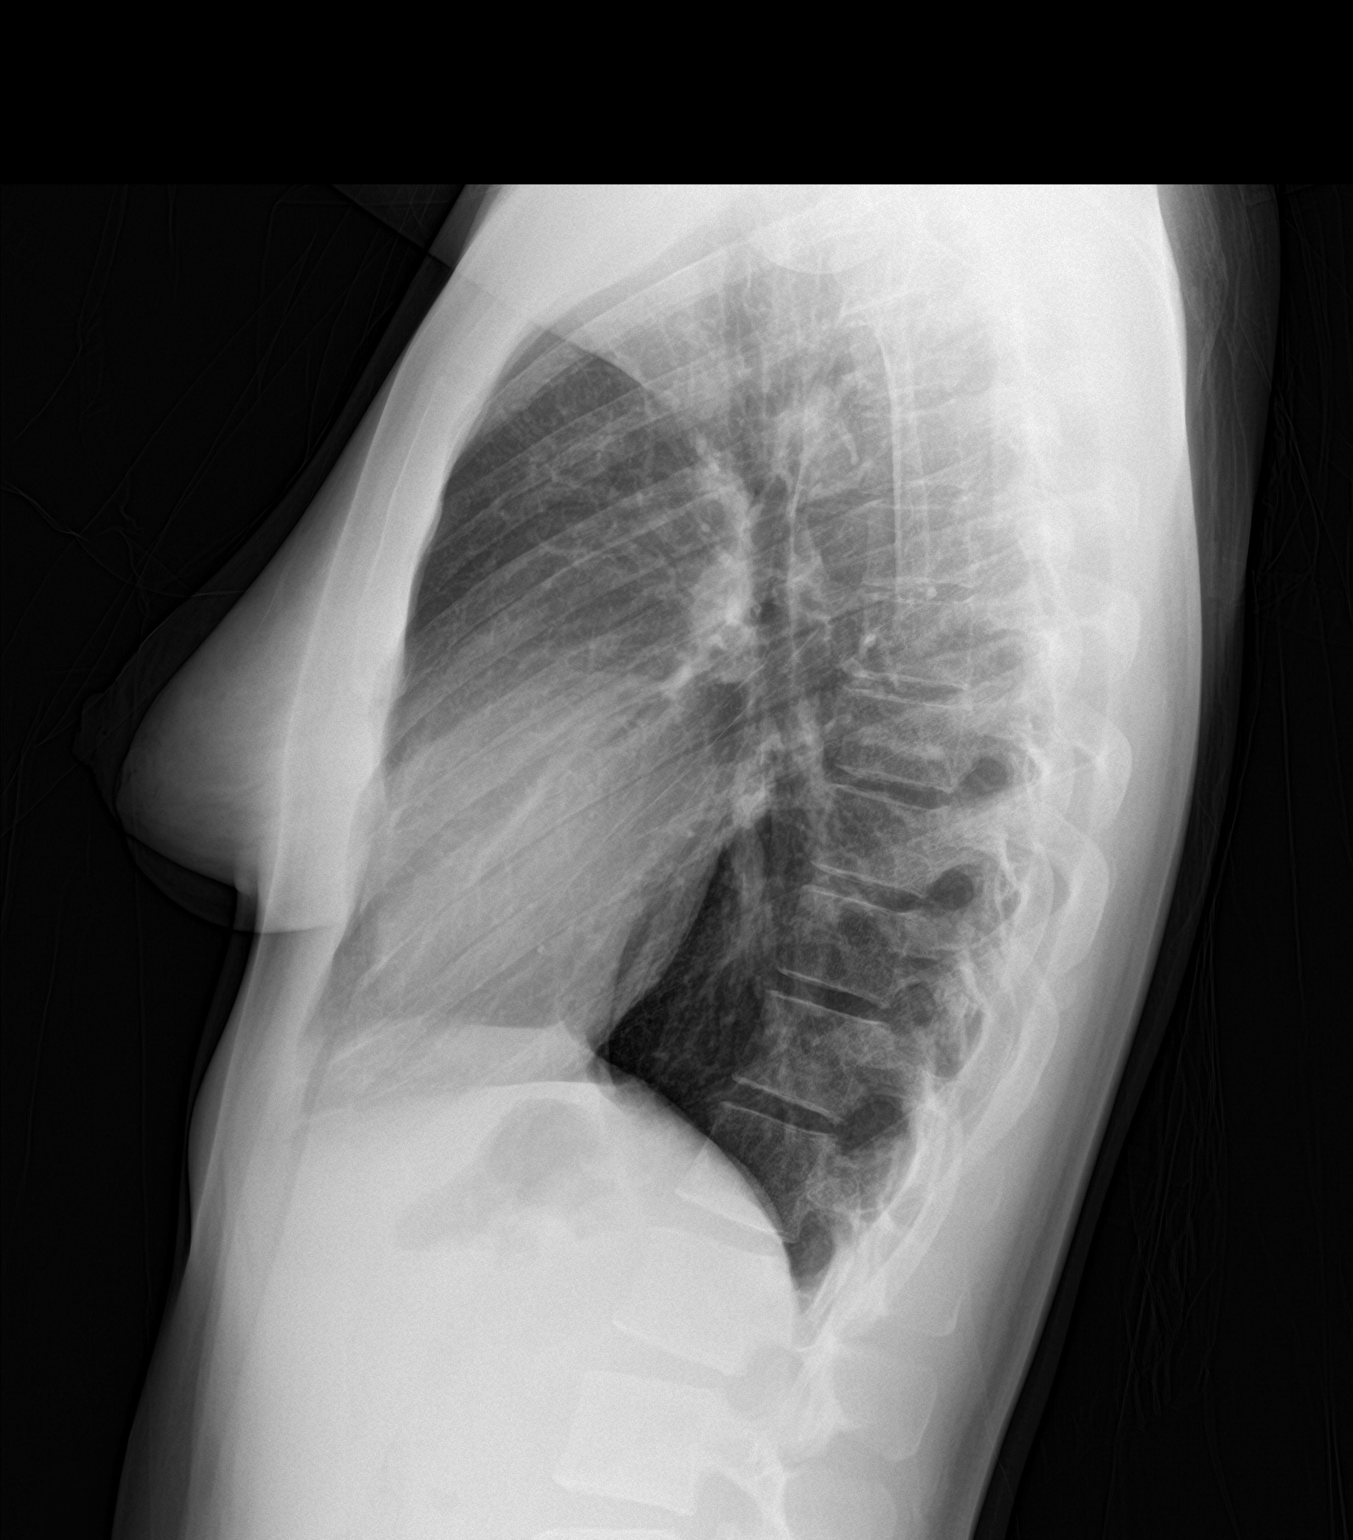

[2 of 2 positions shown; findings below may reference images not displayed]

FINDINGS: The heart size and mediastinal contours are within normal limits.
Both lungs are clear. The visualized skeletal structures are
unremarkable.
IMPRESSION: No active cardiopulmonary disease.

By: Omarali Vi M.D.

## 2018-10-08 ENCOUNTER — Encounter (HOSPITAL_BASED_OUTPATIENT_CLINIC_OR_DEPARTMENT_OTHER): Payer: Self-pay | Admitting: Emergency Medicine

## 2018-10-08 ENCOUNTER — Other Ambulatory Visit: Payer: Self-pay

## 2018-10-08 ENCOUNTER — Emergency Department (HOSPITAL_BASED_OUTPATIENT_CLINIC_OR_DEPARTMENT_OTHER)
Admission: EM | Admit: 2018-10-08 | Discharge: 2018-10-08 | Disposition: A | Discharge status: Home / Self Care | Payer: PRIVATE HEALTH INSURANCE | Attending: Emergency Medicine | Admitting: Emergency Medicine

## 2018-10-08 DIAGNOSIS — Z79899 Other long term (current) drug therapy: Secondary | ICD-10-CM | POA: Insufficient documentation

## 2018-10-08 DIAGNOSIS — J111 Influenza due to unidentified influenza virus with other respiratory manifestations: Secondary | ICD-10-CM | POA: Insufficient documentation

## 2018-10-08 DIAGNOSIS — R69 Illness, unspecified: Secondary | ICD-10-CM

## 2018-10-08 DIAGNOSIS — R05 Cough: Secondary | ICD-10-CM | POA: Diagnosis present

## 2018-10-08 NOTE — ED Provider Notes (Addendum)
[facility] EMERGENCY DEPARTMENT Provider Note   CSN: [id number] Arrival date & time: 10/08/18  1135    History   Chief Complaint Chief Complaint  Patient presents with  . Cough  . Fever    HPI Tabitha Robinson is a 28 y.o. female who presents with flu like symptoms. No significant PMH. She states that she had an acute onset of fever, chills, body aches on Thursday night. She also developed a headache, nasal congestion, and coughing. Her fever was 103 at home last night. She has had multiple sick contacts at work. She has not had a flu shot. She has been taking Dayquil which has helped. She denies ear pain, sore throat, chest pain, SOB, abdominal pain, N/V/D, dysuria. She denies any recent travel.     HPI  History reviewed. No pertinent past medical history.  Patient Active Problem List   Diagnosis Date Noted  . Chest pain, non-cardiac 04/02/2017    Past Surgical History:  Procedure Laterality Date  . CESAREAN SECTION       OB History    Gravida  1   Para      Term      Preterm      AB      Living        SAB      TAB      Ectopic      Multiple      Live Births               Home Medications    Prior to Admission medications   Medication Sig Start Date End Date Taking? Authorizing Provider  benzonatate (TESSALON) 100 MG capsule Take 1 capsule (100 mg total) by mouth every 8 (eight) hours. 08/02/17   Roxy Horseman, PA-C  fluticasone (FLONASE) 50 MCG/ACT nasal spray Place 1 spray into both nostrils daily. 08/02/17   Roxy Horseman, PA-C  norethindrone-ethinyl estradiol (JUNEL FE,GILDESS FE,LOESTRIN FE) 1-20 MG-MCG tablet Take 1 tablet by mouth daily.    [provider]    Family History History reviewed. No pertinent family history.  Social History Social History   Tobacco Use  . Smoking status: Never Smoker  . Smokeless tobacco: Never Used  Substance Use Topics  . Alcohol use: No  . Drug use: Yes    Types:  Marijuana     Allergies   Amoxicillin   Review of Systems Review of Systems  Constitutional: Positive for appetite change, chills, diaphoresis and fever.  HENT: Positive for congestion.   Respiratory: Positive for cough. Negative for shortness of breath.   Cardiovascular: Negative for chest pain.  Gastrointestinal: Negative for abdominal pain, diarrhea, nausea and vomiting.  Genitourinary: Negative for dysuria.  All other systems reviewed and are negative.    Physical Exam Updated Vital Signs BP 120/78 (BP Location: Right Arm)   Pulse 96   Temp 98.9 F (37.2 C) (Oral)   Resp 20   Ht 5\' 6"  (1.676 m)   Wt 77.1 kg   LMP 09/21/2018 (Approximate)   SpO2 98%   BMI 27.44 kg/m   Physical Exam Vitals signs and nursing note reviewed.  Constitutional:      General: She is not in acute distress.    Appearance: Normal appearance. She is well-developed. She is not ill-appearing.     Comments: Calm, cooperative  HENT:     Head: Normocephalic and atraumatic.     Right Ear: Hearing, tympanic membrane, ear canal and external ear normal.  Left Ear: Hearing, tympanic membrane, ear canal and external ear normal.     Nose: Congestion present.     Mouth/Throat:     Lips: Pink.     Mouth: Mucous membranes are moist.     Tongue: No lesions.     Pharynx: Oropharynx is clear.  Eyes:     General: No scleral icterus.       Right eye: No discharge.        Left eye: No discharge.     Conjunctiva/sclera: Conjunctivae normal.     Pupils: Pupils are equal, round, and reactive to light.  Neck:     Musculoskeletal: Normal range of motion.  Cardiovascular:     Rate and Rhythm: Tachycardia present.  Pulmonary:     Effort: Pulmonary effort is normal. No respiratory distress.     Breath sounds: Normal breath sounds.  Abdominal:     General: There is no distension.     Palpations: Abdomen is soft.     Tenderness: There is no abdominal tenderness.  Skin:    General: Skin is warm and dry.   Neurological:     Mental Status: She is alert and oriented to person, place, and time.  Psychiatric:        Behavior: Behavior normal.      ED Treatments / Results  Labs (all labs ordered are listed, but only abnormal results are displayed) Labs Reviewed - No data to display  EKG None  Radiology No results found.  Procedures Procedures (including critical care time)  Medications Ordered in ED Medications - No data to display   Initial Impression / Assessment and Plan / ED Course  I have reviewed the triage vital signs and the nursing notes.  Pertinent labs & imaging results that were available during my care of the patient were reviewed by me and considered in my medical decision making (see chart for details).  28 year old with flu like symptoms. Vitals are normal here other than mild tachycardia. History and exam is consistent with flu. She was offered Tamiflu and declined. Advised supportive care.  Final Clinical Impressions(s) / ED Diagnoses   Final diagnoses:  Influenza-like illness    ED Discharge Orders    None         Bethel Born, PA-C 10/08/18 1227    Loren Racer, MD 10/08/18 1440

## 2018-10-08 NOTE — Discharge Instructions (Signed)
Please rest and drink plenty of fluids.  Continue Tylenol or Motrin for pain/fever Return to the ED for worsening symptoms

## 2018-10-08 NOTE — ED Triage Notes (Addendum)
Pt c/o cough, chest and nasal congestion with generalized body aches that started Thursday night. Denies sore throat. NAD, normal gait. Pt denies taking medication for fever, body aches

## 2019-08-24 ENCOUNTER — Emergency Department (HOSPITAL_BASED_OUTPATIENT_CLINIC_OR_DEPARTMENT_OTHER)
Admission: EM | Admit: 2019-08-24 | Discharge: 2019-08-24 | Disposition: A | Discharge status: Home / Self Care | Payer: Medicaid Other | Attending: Emergency Medicine | Admitting: Emergency Medicine

## 2019-08-24 ENCOUNTER — Encounter (HOSPITAL_BASED_OUTPATIENT_CLINIC_OR_DEPARTMENT_OTHER): Payer: Self-pay | Admitting: *Deleted

## 2019-08-24 ENCOUNTER — Other Ambulatory Visit: Payer: Self-pay

## 2019-08-24 DIAGNOSIS — Z5321 Procedure and treatment not carried out due to patient leaving prior to being seen by health care provider: Secondary | ICD-10-CM | POA: Diagnosis not present

## 2019-08-24 DIAGNOSIS — R05 Cough: Secondary | ICD-10-CM | POA: Insufficient documentation

## 2019-08-24 DIAGNOSIS — R519 Headache, unspecified: Secondary | ICD-10-CM | POA: Insufficient documentation

## 2019-08-24 DIAGNOSIS — R439 Unspecified disturbances of smell and taste: Secondary | ICD-10-CM | POA: Insufficient documentation

## 2019-08-24 NOTE — ED Triage Notes (Signed)
Woke this am with loss of taste and loss of smell. She has had a cough, headache and body aches x 2 days. No known Covid exposures.

## 2019-08-26 ENCOUNTER — Emergency Department (HOSPITAL_BASED_OUTPATIENT_CLINIC_OR_DEPARTMENT_OTHER): Payer: Medicaid Other

## 2019-08-26 ENCOUNTER — Other Ambulatory Visit: Payer: Self-pay

## 2019-08-26 ENCOUNTER — Encounter (HOSPITAL_BASED_OUTPATIENT_CLINIC_OR_DEPARTMENT_OTHER): Payer: Self-pay | Admitting: *Deleted

## 2019-08-26 ENCOUNTER — Emergency Department (HOSPITAL_BASED_OUTPATIENT_CLINIC_OR_DEPARTMENT_OTHER)
Admission: EM | Admit: 2019-08-26 | Discharge: 2019-08-26 | Disposition: A | Discharge status: Home / Self Care | Payer: Medicaid Other | Attending: Emergency Medicine | Admitting: Emergency Medicine

## 2019-08-26 DIAGNOSIS — Z880 Allergy status to penicillin: Secondary | ICD-10-CM | POA: Insufficient documentation

## 2019-08-26 DIAGNOSIS — U071 COVID-19: Secondary | ICD-10-CM | POA: Diagnosis not present

## 2019-08-26 DIAGNOSIS — Z79899 Other long term (current) drug therapy: Secondary | ICD-10-CM | POA: Insufficient documentation

## 2019-08-26 DIAGNOSIS — R05 Cough: Secondary | ICD-10-CM | POA: Diagnosis present

## 2019-08-26 LAB — SARS CORONAVIRUS 2 AG (30 MIN TAT): SARS Coronavirus 2 Ag: POSITIVE — AB

## 2019-08-26 NOTE — ED Notes (Signed)
ED Provider at bedside. 

## 2019-08-26 NOTE — ED Provider Notes (Signed)
Tabitha Robinson   CSN: [id number] Arrival date & time: 08/26/19  1357     History Chief Complaint  Patient presents with  . Cough    loss of smell    Tabitha Robinson is a 29 y.o. female.  Tabitha Robinson is a 29 y.o. female who is otherwise healthy, presents to the ED for evaluation of 5 days of nasal congestion, rhinorrhea and cough.  She states that 2 to 3 days ago she noticed that she lost her taste and smell.  She has had some fatigue and mild headaches but denies fevers or chills.  No chest pain or shortness of breath, no abdominal pain, vomiting or diarrhea.  She denies any known sick contacts, states that she often has nasal congestion this time of year but with the loss of taste and smell she became concerned this could be Covid.  She states that her 67-year-old had a fever yesterday, but no one else around her has had concerning symptoms.  She states that she is currently out of work.  Has not taken any medications prior to arrival.  No other aggravating or alleviating factors.        History reviewed. No pertinent past medical history.  Patient Active Problem List   Diagnosis Date Noted  . Chest pain, non-cardiac 04/02/2017    Past Surgical History:  Procedure Laterality Date  . CESAREAN SECTION       OB History    Gravida  1   Para      Term      Preterm      AB      Living        SAB      TAB      Ectopic      Multiple      Live Births              No family history on file.  Social History   Tobacco Use  . Smoking status: Never Smoker  . Smokeless tobacco: Never Used  Substance Use Topics  . Alcohol use: Yes    Comment: occasional  . Drug use: Not Currently    Types: Marijuana    Home Medications Prior to Admission medications   Medication Sig Start Date End Date Taking? Authorizing Provider  benzonatate (TESSALON) 100 MG capsule Take 1 capsule (100 mg total) by mouth every 8 (eight)  hours. 08/02/17   Montine Circle, PA-C  fluticasone (FLONASE) 50 MCG/ACT nasal spray Place 1 spray into both nostrils daily. 08/02/17   Montine Circle, PA-C  norethindrone-ethinyl estradiol (JUNEL FE,GILDESS FE,LOESTRIN FE) 1-20 MG-MCG tablet Take 1 tablet by mouth daily.    [provider]    Allergies    Amoxicillin  Review of Systems   Review of Systems  Constitutional: Positive for fatigue. Negative for chills and fever.  HENT: Positive for congestion and rhinorrhea. Negative for sore throat.   Respiratory: Positive for cough. Negative for shortness of breath.   Cardiovascular: Negative for chest pain.  Gastrointestinal: Negative for abdominal pain, diarrhea, nausea and vomiting.  Musculoskeletal: Negative for myalgias.  Skin: Negative for color change and rash.  Neurological: Positive for headaches.    Physical Exam Updated Vital Signs BP 103/75 (BP Location: Right Wrist)   Pulse 81   Temp 98.2 F (36.8 C) (Oral)   Resp 16   Ht 5' 6"  (1.676 m)   Wt 79.4 kg   LMP 08/14/2019   SpO2  98%   BMI 28.25 kg/m   Physical Exam Vitals and nursing Robinson reviewed.  Constitutional:      General: She is not in acute distress.    Appearance: Normal appearance. She is well-developed and normal weight. She is not ill-appearing or diaphoretic.  HENT:     Head: Normocephalic and atraumatic.     Nose: Congestion and rhinorrhea present.     Mouth/Throat:     Mouth: Mucous membranes are moist.     Pharynx: Oropharynx is clear.  Eyes:     General:        Right eye: No discharge.        Left eye: No discharge.  Neck:     Comments: No rigidity Cardiovascular:     Rate and Rhythm: Normal rate and regular rhythm.     Heart sounds: Normal heart sounds.  Pulmonary:     Effort: Pulmonary effort is normal. No respiratory distress.     Breath sounds: Normal breath sounds.     Comments: Respirations equal and unlabored, patient able to speak in full sentences, lungs clear to  auscultation bilaterally Abdominal:     General: Bowel sounds are normal. There is no distension.     Palpations: Abdomen is soft. There is no mass.     Tenderness: There is no abdominal tenderness. There is no guarding.     Comments: Abdomen soft, nondistended, nontender to palpation in all quadrants without guarding or peritoneal signs  Musculoskeletal:        General: No deformity.     Cervical back: Neck supple.  Lymphadenopathy:     Cervical: No cervical adenopathy.  Skin:    General: Skin is warm and dry.     Capillary Refill: Capillary refill takes less than 2 seconds.  Neurological:     Mental Status: She is alert and oriented to person, place, and time.  Psychiatric:        Mood and Affect: Mood normal.        Behavior: Behavior normal.     ED Results / Procedures / Treatments   Labs (all labs ordered are listed, but only abnormal results are displayed) Labs Reviewed  SARS CORONAVIRUS 2 AG (30 MIN TAT) - Abnormal; Notable for the following components:      Result Value   SARS Coronavirus 2 Ag POSITIVE (*)    All other components within normal limits    EKG None  Radiology DG Chest Portable 1 View  Result Date: 08/26/2019 CLINICAL DATA:  Possible COVID-19. Loss of taste and smell for 3 days. No fever or cough. EXAM: PORTABLE CHEST 1 VIEW COMPARISON:  April 02, 2017 FINDINGS: The heart size and mediastinal contours are within normal limits. Both lungs are clear. The visualized skeletal structures are unremarkable. IMPRESSION: No active disease. Electronically Signed   By: Dorise Bullion III M.D   On: 08/26/2019 17:13    Procedures Procedures (including critical care time)  Medications Ordered in ED Medications - No data to display  ED Course  I have reviewed the triage vital signs and the nursing notes.  Pertinent labs & imaging results that were available during my care of the patient were reviewed by me and considered in my medical decision making (see  chart for details).  Clinical Course as of Aug 26 1727  Sat Aug 26, 2019  1705 Rapid Covid test is positive which explains patient's symptoms  SARS Coronavirus 2 Ag (30 min TAT) - Nasal Swab (BD Veritor Kit)(!) [  KF]  1713 Chest x-ray is clear, no evidence of pneumonia  DG Chest Portable 1 View [KF]    Clinical Course User Index [KF] Tabitha Robinson   MDM Rules/Calculators/A&P                      Patient presents with 5 days of nasal congestion, cough, loss of taste and smell, as well as fatigue, she has tested positive for Covid in the ED today, chest x-ray is clear, vitals are normal and she is satting well on room air.  She does not exhibit signs or symptoms of more concerning disease that would require admission or further evaluation.  Will discharge home with symptomatic treatment.  Strict return precautions discussed.  Mieshia Pepitone was evaluated in Emergency Department on 08/26/2019 for the symptoms described in the history of present illness. She was evaluated in the context of the global COVID-19 pandemic, which necessitated consideration that the patient might be at risk for infection with the SARS-CoV-2 virus that causes COVID-19. Institutional protocols and algorithms that pertain to the evaluation of patients at risk for COVID-19 are in a state of rapid change based on information released by regulatory bodies including the CDC and federal and state organizations. These policies and algorithms were followed during the patient's care in the ED.  Final Clinical Impression(s) / ED Diagnoses Final diagnoses:  COVID-19 virus infection    Rx / DC Orders ED Discharge Orders    None       Tabitha Robinson 08/26/19 1731    Isla Pence, MD 08/26/19 2131

## 2019-08-26 NOTE — ED Triage Notes (Signed)
Pt reports loss of smell and taste, cough, headache, body aches x 5 days. She was here 2 days ago but left prior to being seen by MD

## 2019-08-26 NOTE — Discharge Instructions (Signed)
You have tested positive for COVID-19 virus.  Please continue to quarantine at home and monitor your symptoms closely. You chest x-ray was clear. Antibiotics are not helpful in treating viral infection, the virus should run its course in about 10-14 days. Please make sure you are drinking plenty of fluids. You can treat your symptoms supportively with tylenol for fevers and pains, and over the counter cough syrups and throat lozenges to help with cough. If your symptoms are not improving please follow up with you Primary doctor.  ° °I recommend that you purchase a home pulse ox to help better monitor your oxygen at home, if you start to have increased work of breathing or shortness of breath or your oxygen drops below 90% please immediately return to the hospital for reevaluation. ° °If you develop persistent fevers, shortness of breath or difficulty breathing, chest pain, severe headache and neck pain, persistent nausea and vomiting or other new or concerning symptoms return to the Emergency department. ° °

## 2019-08-26 NOTE — ED Notes (Signed)
X-ray at bedside

## 2020-06-29 IMAGING — DX DG CHEST 1V PORT
1 series · 1 of 1 positions shown · non-contrast
Comparison: April 02, 2017

CLINICAL DATA: Possible RBKKH-CK. Loss of taste and smell for 3
days. No fever or cough.

EXAM:
PORTABLE CHEST 1 VIEW

[chest ap]
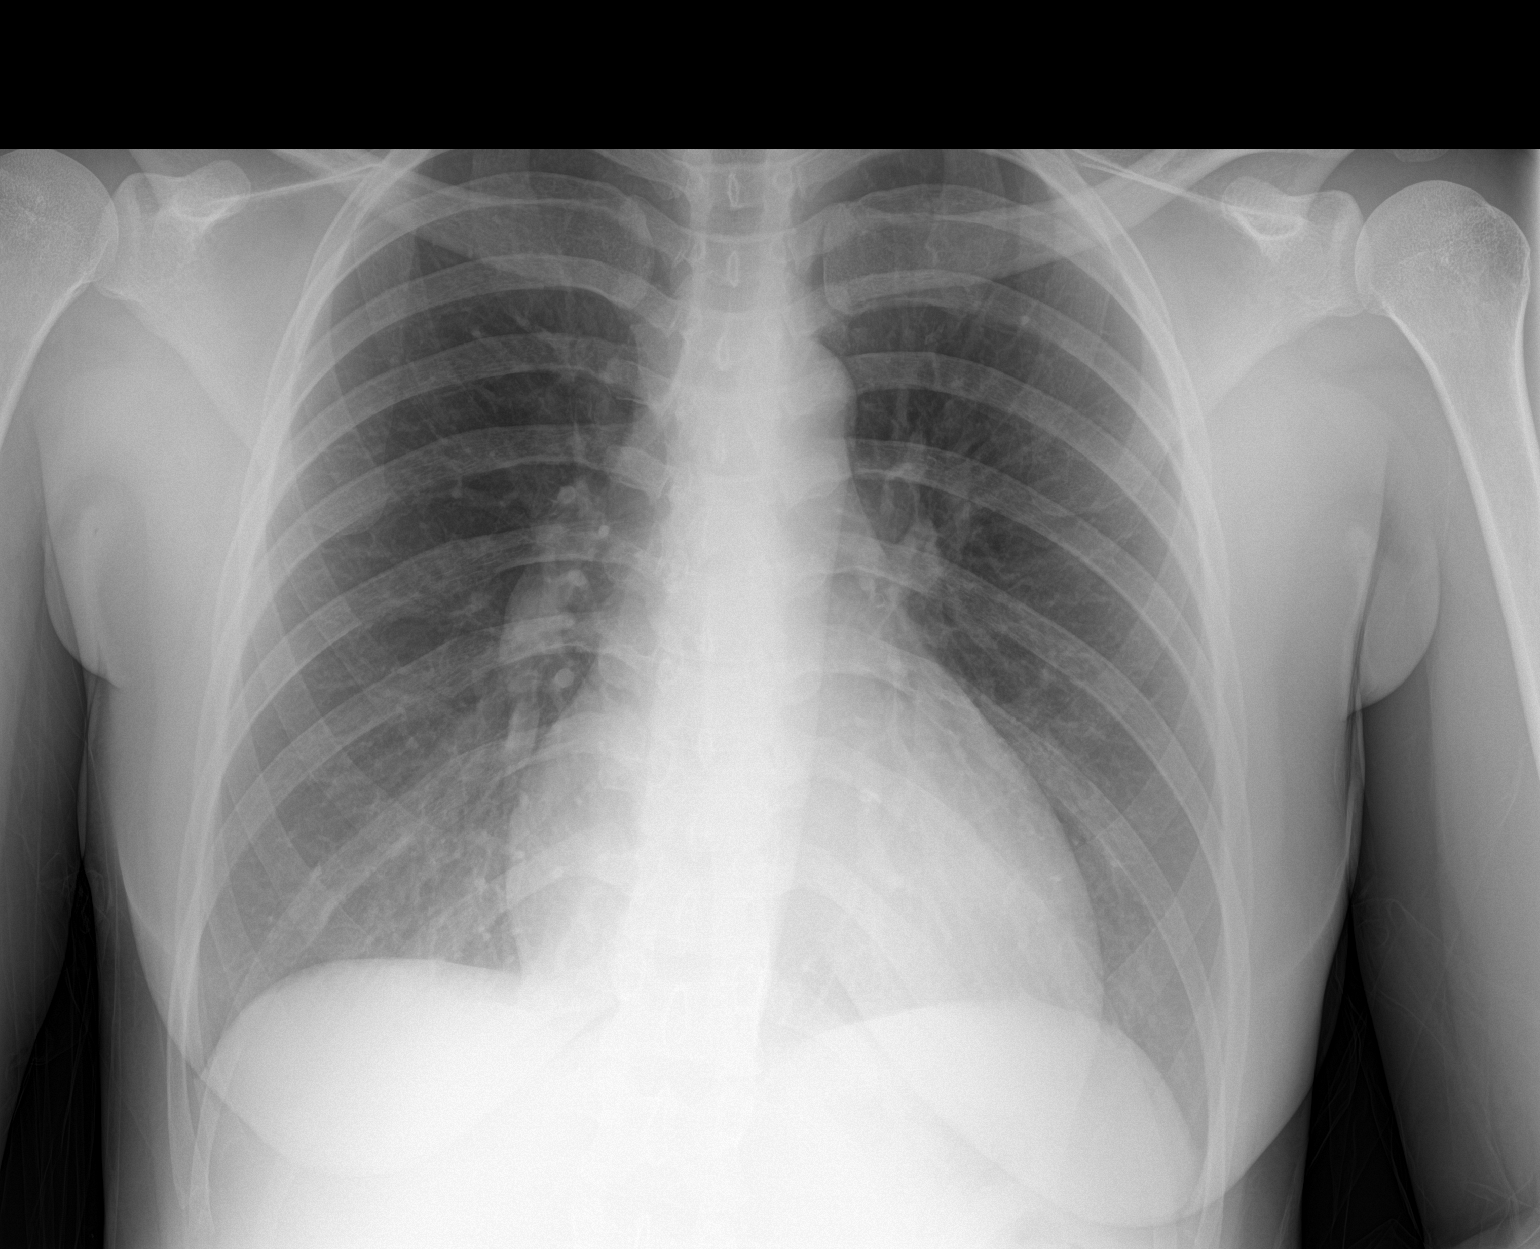

[1 of 1 positions shown; findings below may reference images not displayed]

FINDINGS: The heart size and mediastinal contours are within normal limits.
Both lungs are clear. The visualized skeletal structures are
unremarkable.
IMPRESSION: No active disease.

## 2020-08-08 ENCOUNTER — Encounter: Payer: Self-pay | Admitting: Obstetrics and Gynecology

## 2020-08-08 ENCOUNTER — Other Ambulatory Visit: Payer: Self-pay

## 2020-08-08 ENCOUNTER — Other Ambulatory Visit (HOSPITAL_COMMUNITY)
Admission: RE | Admit: 2020-08-08 | Discharge: 2020-08-08 | Disposition: A | Discharge status: Home / Self Care | Payer: Medicaid Other | Source: Ambulatory Visit | Attending: Obstetrics and Gynecology | Admitting: Obstetrics and Gynecology

## 2020-08-08 ENCOUNTER — Ambulatory Visit (INDEPENDENT_AMBULATORY_CARE_PROVIDER_SITE_OTHER): Payer: Medicaid Other | Admitting: Obstetrics and Gynecology

## 2020-08-08 VITALS — BP 110/70 | Ht 66.0 in | Wt 186.0 lb

## 2020-08-08 DIAGNOSIS — B9689 Other specified bacterial agents as the cause of diseases classified elsewhere: Secondary | ICD-10-CM

## 2020-08-08 DIAGNOSIS — N760 Acute vaginitis: Secondary | ICD-10-CM | POA: Diagnosis not present

## 2020-08-08 DIAGNOSIS — Z124 Encounter for screening for malignant neoplasm of cervix: Secondary | ICD-10-CM | POA: Diagnosis not present

## 2020-08-08 DIAGNOSIS — Z113 Encounter for screening for infections with a predominantly sexual mode of transmission: Secondary | ICD-10-CM

## 2020-08-08 DIAGNOSIS — Z30011 Encounter for initial prescription of contraceptive pills: Secondary | ICD-10-CM | POA: Diagnosis not present

## 2020-08-08 LAB — POCT WET PREP WITH KOH
KOH Prep POC: NEGATIVE
Trichomonas, UA: NEGATIVE
Yeast Wet Prep HPF POC: NEGATIVE

## 2020-08-08 MED ORDER — METRONIDAZOLE 500 MG PO TABS: 500.0000 mg | ORAL_TABLET | Freq: Two times a day (BID) | ORAL | 0 refills | Status: AC

## 2020-08-08 MED ORDER — NORETHIN ACE-ETH ESTRAD-FE 1-20 MG-MCG PO TABS: 1.0000 | ORAL_TABLET | Freq: Every day | ORAL | 11 refills | Status: AC

## 2020-08-08 NOTE — Patient Instructions (Signed)
I value your feedback and entrusting us with your care. If you get a  patient survey, I would appreciate you taking the time to let us know about your experience today. Thank you!  As of July 27, 2019, your lab results will be released to your MyChart immediately, before I even have a chance to see them. Please give me time to review them and contact you if there are any abnormalities. Thank you for your patience.  

## 2020-08-08 NOTE — Progress Notes (Signed)
[facility]   Chief Complaint  Patient presents with  . Contraception    No BC currently, not sure which BC  . STD testing    HPI:      Ms. Tabitha Robinson is a 29 y.o. G1P0 whose LMP was Patient's last menstrual period was 06/30/2020 (exact date)., presents today for NP [facility] consult. Menses are monthly, lasting 5-7 days, no BTB, min dysmen. Hasn't been sex active in 6 month due to long distance relationship. Would like to restart BC. Did pills, depo and nexplanon in past. Didn't like depo and had irreg bleeding with nexplanon. Would like to restart pills. No hx of HTN, DVTs, migraines with aura.  No recent pap, no hx of abn pap. Would like STD testing to be safe. Hx of gonorrhea in high school.  Hx of recurrent BV, sx improved in past 6 months since not sex active.Treated with flagyl in past. Mild sx today. Feels like menses triggers sx. Unscented soap, no dryer sheets or wipes, not taking probiotics, has used boric acid supp occas. Uses condoms.    Past Medical History:  Diagnosis Date  . BV (bacterial vaginosis)   . Gonorrhea     Past Surgical History:  Procedure Laterality Date  . CESAREAN SECTION      Family History  Problem Relation Age of Onset  . Breast cancer Paternal Grandmother 75    Social History   Socioeconomic History  . Marital status: Single    Spouse name: Not on file  . Number of children: Not on file  . Years of education: Not on file  . Highest education level: Not on file  Occupational History  . Not on file  Tobacco Use  . Smoking status: Never Smoker  . Smokeless tobacco: Never Used  Vaping Use  . Vaping Use: Never used  Substance and Sexual Activity  . Alcohol use: Yes    Comment: occasional  . Drug use: Not Currently    Types: Marijuana  . Sexual activity: Not Currently    Birth control/protection: None  Other Topics Concern  . Not on file  Social History Narrative  . Not on file   Social Determinants  of Health   Financial Resource Strain: Not on file  Food Insecurity: Not on file  Transportation Needs: Not on file  Physical Activity: Not on file  Stress: Not on file  Social Connections: Not on file  Intimate Partner Violence: Not on file    Outpatient Medications Prior to Visit  Medication Sig Dispense Refill  . benzonatate (TESSALON) 100 MG capsule Take 1 capsule (100 mg total) by mouth every 8 (eight) hours. 21 capsule 0  . fluticasone (FLONASE) 50 MCG/ACT nasal spray Place 1 spray into both nostrils daily. 16 g 0  . norethindrone-ethinyl estradiol (JUNEL FE,GILDESS FE,LOESTRIN FE) 1-20 MG-MCG tablet Take 1 tablet by mouth daily.     No facility-administered medications prior to visit.      ROS:  Review of Systems  Constitutional: Negative for fever, malaise/fatigue and weight loss.  Gastrointestinal: Negative for blood in stool, constipation, diarrhea, nausea and vomiting.  Genitourinary: Positive for vaginal discharge. Negative for dyspareunia, dysuria, flank pain, frequency, hematuria, urgency, vaginal bleeding and vaginal pain.  Musculoskeletal: Negative for back pain.  Skin: Negative for itching and rash.  BREAST: No symptoms   OBJECTIVE:   Vitals:  BP 110/70   Ht 5\' 6"  (1.676 m)   Wt 186 lb (84.4 kg)  LMP 06/30/2020 (Exact Date)   Breastfeeding No   BMI 30.02 kg/m   Physical Exam Vitals reviewed.  Constitutional:      Appearance: She is well-developed.  Pulmonary:     Effort: Pulmonary effort is normal.  Genitourinary:    General: Normal vulva.     Pubic Area: No rash.      Labia:        Right: No rash, tenderness or lesion.        Left: No rash, tenderness or lesion.      Vagina: Normal. No vaginal discharge, erythema or tenderness.     Cervix: Normal.     Uterus: Normal. Not enlarged and not tender.      Adnexa: Right adnexa normal and left adnexa normal.       Right: No mass or tenderness.         Left: No mass or tenderness.     Musculoskeletal:        General: Normal range of motion.     Cervical back: Normal range of motion.  Skin:    General: Skin is warm and dry.  Neurological:     General: No focal deficit present.     Mental Status: She is alert and oriented to person, place, and time.  Psychiatric:        Mood and Affect: Mood normal.        Behavior: Behavior normal.        Thought Content: Thought content normal.        Judgment: Judgment normal.     Results: Results for orders placed or performed in visit on 08/08/20 (from the past 24 hour(s))  POCT Wet Prep with KOH     Status: Abnormal   Collection Time: 08/08/20 10:45 AM  Result Value Ref Range   Trichomonas, UA Negative    Clue Cells Wet Prep HPF POC few    Epithelial Wet Prep HPF POC     Yeast Wet Prep HPF POC neg    Bacteria Wet Prep HPF POC     RBC Wet Prep HPF POC     WBC Wet Prep HPF POC     KOH Prep POC Negative Negative     Assessment/Plan: Encounter for initial prescription of contraceptive pills - Plan: norethindrone-ethinyl estradiol (JUNEL FE 1/20) 1-20 MG-MCG tablet; OCP start Sun, condoms. Rx eRxd. F/u prn.   Cervical cancer screening - Plan: Cytology - PAP  Screening for STD (sexually transmitted disease) - Plan: Cytology - PAP  Bacterial vaginosis - Plan: metroNIDAZOLE (FLAGYL) 500 MG tablet, POCT Wet Prep with KOH; pos sx and wet prep. Rx flagyl, no EtOH. Will RF if sx recur. Add probiotics/resume boric acid supp.    Meds ordered this encounter  Medications  . metroNIDAZOLE (FLAGYL) 500 MG tablet    Sig: Take 1 tablet (500 mg total) by mouth 2 (two) times daily for 7 days.    Dispense:  14 tablet    Refill:  0    Order Specific Question:   Supervising Provider    Answer:   Nadara Mustard [id number]  . norethindrone-ethinyl estradiol (JUNEL FE 1/20) 1-20 MG-MCG tablet    Sig: Take 1 tablet by mouth daily.    Dispense:  28 tablet    Refill:  11    Order Specific Question:   Supervising Provider    Answer:    Nadara Mustard [[id number]]      Return in about 1 year (around 08/08/2021), or  if symptoms worsen or fail to improve.  Palmyra Rogacki B. Anjolaoluwa Siguenza, PA-C 08/08/2020 10:46 AM

## 2020-08-15 LAB — CYTOLOGY - PAP
Adequacy: ABSENT
Chlamydia: NEGATIVE
Comment: NEGATIVE
Comment: NEGATIVE
Comment: NORMAL
Diagnosis: UNDETERMINED — AB
High risk HPV: NEGATIVE
Neisseria Gonorrhea: NEGATIVE
